# Patient Record
Sex: Female | Born: 1980 | Race: White | Hispanic: No | Marital: Married | Smoking: Never smoker
Health system: Southern US, Community
[De-identification: ages and names within clinical notes are randomized; demographics above are authoritative.]

## PROBLEM LIST (undated history)

## (undated) HISTORY — PX: TONSILLECTOMY: SUR1361

## (undated) HISTORY — PX: TUBAL LIGATION: SHX77

---

## 2006-12-29 ENCOUNTER — Emergency Department: Payer: Self-pay | Admitting: Emergency Medicine

## 2008-07-01 ENCOUNTER — Emergency Department: Payer: Self-pay | Admitting: Emergency Medicine

## 2008-10-19 ENCOUNTER — Encounter: Payer: Self-pay | Admitting: Obstetrics and Gynecology

## 2009-02-14 ENCOUNTER — Observation Stay: Payer: Self-pay | Admitting: Obstetrics and Gynecology

## 2009-02-20 ENCOUNTER — Inpatient Hospital Stay: Payer: Self-pay | Admitting: Obstetrics and Gynecology

## 2009-09-19 ENCOUNTER — Emergency Department: Payer: Self-pay | Admitting: Emergency Medicine

## 2011-01-30 ENCOUNTER — Ambulatory Visit: Payer: Self-pay

## 2013-06-22 ENCOUNTER — Emergency Department: Payer: Self-pay | Admitting: Emergency Medicine

## 2013-09-30 ENCOUNTER — Emergency Department: Payer: Self-pay | Admitting: Emergency Medicine

## 2014-03-08 ENCOUNTER — Emergency Department: Payer: Self-pay | Admitting: Internal Medicine

## 2014-03-30 ENCOUNTER — Emergency Department: Payer: Self-pay | Admitting: Emergency Medicine

## 2014-06-12 ENCOUNTER — Emergency Department: Payer: Self-pay | Admitting: Emergency Medicine

## 2014-11-30 ENCOUNTER — Emergency Department: Payer: Self-pay | Admitting: Emergency Medicine

## 2014-12-26 ENCOUNTER — Emergency Department: Payer: Self-pay | Admitting: Student

## 2015-03-31 ENCOUNTER — Encounter: Payer: Self-pay | Admitting: Emergency Medicine

## 2015-03-31 ENCOUNTER — Emergency Department
Admission: EM | Admit: 2015-03-31 | Discharge: 2015-03-31 | Disposition: A | Discharge status: Home / Self Care | Payer: Self-pay | Attending: Emergency Medicine | Admitting: Emergency Medicine

## 2015-03-31 ENCOUNTER — Emergency Department: Payer: Self-pay

## 2015-03-31 DIAGNOSIS — Y9389 Activity, other specified: Secondary | ICD-10-CM | POA: Insufficient documentation

## 2015-03-31 DIAGNOSIS — Y998 Other external cause status: Secondary | ICD-10-CM | POA: Insufficient documentation

## 2015-03-31 DIAGNOSIS — S8992XA Unspecified injury of left lower leg, initial encounter: Secondary | ICD-10-CM | POA: Insufficient documentation

## 2015-03-31 DIAGNOSIS — Y9289 Other specified places as the place of occurrence of the external cause: Secondary | ICD-10-CM | POA: Insufficient documentation

## 2015-03-31 DIAGNOSIS — S5002XA Contusion of left elbow, initial encounter: Secondary | ICD-10-CM | POA: Insufficient documentation

## 2015-03-31 DIAGNOSIS — S60419A Abrasion of unspecified finger, initial encounter: Secondary | ICD-10-CM | POA: Insufficient documentation

## 2015-03-31 DIAGNOSIS — S43402A Unspecified sprain of left shoulder joint, initial encounter: Secondary | ICD-10-CM | POA: Insufficient documentation

## 2015-03-31 DIAGNOSIS — S199XXA Unspecified injury of neck, initial encounter: Secondary | ICD-10-CM | POA: Insufficient documentation

## 2015-03-31 DIAGNOSIS — W108XXA Fall (on) (from) other stairs and steps, initial encounter: Secondary | ICD-10-CM | POA: Insufficient documentation

## 2015-03-31 DIAGNOSIS — S3992XA Unspecified injury of lower back, initial encounter: Secondary | ICD-10-CM | POA: Insufficient documentation

## 2015-03-31 DIAGNOSIS — S63502A Unspecified sprain of left wrist, initial encounter: Secondary | ICD-10-CM | POA: Insufficient documentation

## 2015-03-31 MED ORDER — HYDROCODONE-ACETAMINOPHEN 5-325 MG PO TABS
1.0000 | ORAL_TABLET | Freq: Once | ORAL | Status: AC
  Administered 2015-03-31: 1 via ORAL

## 2015-03-31 MED ORDER — IBUPROFEN 800 MG PO TABS: 800.0000 mg | ORAL_TABLET | Freq: Three times a day (TID) | ORAL | Status: DC | PRN

## 2015-03-31 MED ORDER — ONDANSETRON 8 MG PO TBDP
ORAL_TABLET | ORAL | Status: AC
  Administered 2015-03-31: 8 mg via ORAL
  Filled 2015-03-31: qty 1

## 2015-03-31 MED ORDER — HYDROCODONE-ACETAMINOPHEN 5-325 MG PO TABS: 1.0000 | ORAL_TABLET | ORAL | Status: DC | PRN

## 2015-03-31 MED ORDER — HYDROCODONE-ACETAMINOPHEN 5-325 MG PO TABS
ORAL_TABLET | ORAL | Status: AC
  Administered 2015-03-31: 1 via ORAL
  Filled 2015-03-31: qty 1

## 2015-03-31 MED ORDER — MORPHINE SULFATE 4 MG/ML IJ SOLN
INTRAMUSCULAR | Status: AC
  Administered 2015-03-31: 4 mg via INTRAMUSCULAR
  Filled 2015-03-31: qty 1

## 2015-03-31 MED ORDER — CYCLOBENZAPRINE HCL 10 MG PO TABS: 10.0000 mg | ORAL_TABLET | Freq: Three times a day (TID) | ORAL | Status: DC | PRN

## 2015-03-31 MED ORDER — ONDANSETRON 8 MG PO TBDP
8.0000 mg | ORAL_TABLET | Freq: Once | ORAL | Status: AC
  Administered 2015-03-31: 8 mg via ORAL

## 2015-03-31 MED ORDER — MORPHINE SULFATE 4 MG/ML IJ SOLN
4.0000 mg | Freq: Once | INTRAMUSCULAR | Status: AC
  Administered 2015-03-31: 4 mg via INTRAMUSCULAR

## 2015-03-31 NOTE — Discharge Instructions (Signed)
Contusion A contusion is a deep bruise. Contusions are the result of an injury that caused bleeding under the skin. The contusion may turn blue, purple, or yellow. Minor injuries will give you a painless contusion, but more severe contusions may stay painful and swollen for a few weeks.  CAUSES  A contusion is usually caused by a blow, trauma, or direct force to an area of the body. SYMPTOMS   Swelling and redness of the injured area.  Bruising of the injured area.  Tenderness and soreness of the injured area.  Pain. DIAGNOSIS  The diagnosis can be made by taking a history and physical exam. An X-ray, CT scan, or MRI may be needed to determine if there were any associated injuries, such as fractures. TREATMENT  Specific treatment will depend on what area of the body was injured. In general, the best treatment for a contusion is resting, icing, elevating, and applying cold compresses to the injured area. Over-the-counter medicines may also be recommended for pain control. Ask your caregiver what the best treatment is for your contusion. HOME CARE INSTRUCTIONS   Put ice on the injured area.  Put ice in a plastic bag.  Place a towel between your skin and the bag.  Leave the ice on for 15-20 minutes, 3-4 times a day, or as directed by your health care provider.  Only take over-the-counter or prescription medicines for pain, discomfort, or fever as directed by your caregiver. Your caregiver may recommend avoiding anti-inflammatory medicines (aspirin, ibuprofen, and naproxen) for 48 hours because these medicines may increase bruising.  Rest the injured area.  If possible, elevate the injured area to reduce swelling. SEEK IMMEDIATE MEDICAL CARE IF:   You have increased bruising or swelling.  You have pain that is getting worse.  Your swelling or pain is not relieved with medicines. MAKE SURE YOU:   Understand these instructions.  Will watch your condition.  Will get help right  away if you are not doing well or get worse. Document Released: 08/13/2005 Document Revised: 11/08/2013 Document Reviewed: 09/08/2011 [place]Exit[place] Patient Information 2015 [place]ExitCare, [place]. This information is not intended to replace advice given to you by your health care provider. Make sure you discuss any questions you have with your health care provider.  Elbow Contusion  An elbow contusion is a deep bruise of the elbow. Contusions happen when an injury causes bleeding under the skin. Signs of bruising include pain, puffiness (swelling), and discolored skin. The contusion may turn blue, purple, or yellow. HOME CARE  Put ice on the injured area.  Put ice in a plastic bag.  Place a towel between your skin and the bag.  Leave the ice on for 15-20 minutes, 03-04 times a day.  Only take medicines as told by your doctor.  Rest your elbow until the pain and puffiness are better.  Raise (elevate) your elbow to lessen puffiness.  Put on an elastic wrap as told by your doctor. You can take it off for sleeping, showers, and baths. If your fingers get cold, blue, or lose feeling (numb), take the wrap off. Put the wrap back on more loosely.  Use your elbow only as told by your doctor. If you are asked to do elbow exercises, do them as told.  Keep all doctor visits as told. GET HELP RIGHT AWAY IF:  You have more redness, puffiness, or pain in your elbow.  Your puffiness or pain is not helped by medicines.  You have puffiness of the hand  and fingers.  You are not able to move your fingers or wrist.  You start to lose feeling in your hand or fingers.  Your fingers or hand become cold or blue. MAKE SURE YOU:   Understand these instructions.  Will watch your condition.  Will get help right away if you are not doing well or get worse. Document Released: 10/23/2011 Document Revised: 05/04/2012 Document Reviewed: 10/23/2011 [facility] Patient Information 2015 [place]ExitCare, [place]LLC. This information  is not intended to replace advice given to you by your health care provider. Make sure you discuss any questions you have with your health care provider.  Joint Sprain A sprain is a tear or stretch in the ligaments that hold a joint together. Severe sprains may need as long as 3-6 weeks of immobilization and/or exercises to heal completely. Sprained joints should be rested and protected. If not, they can become unstable and prone to re-injury. Proper treatment can reduce your pain, shorten the period of disability, and reduce the risk of repeated injuries. TREATMENT   Rest and elevate the injured joint to reduce pain and swelling.  Apply ice packs to the injury for 20-30 minutes every 2-3 hours for the next 2-3 days.  Keep the injury wrapped in a compression bandage or splint as long as the joint is painful or as instructed by your caregiver.  Do not use the injured joint until it is completely healed to prevent re-injury and chronic instability. Follow the instructions of your caregiver.  Long-term sprain management may require exercises and/or treatment by a physical therapist. Taping or special braces may help stabilize the joint until it is completely better. SEEK MEDICAL CARE IF:   You develop increased pain or swelling of the joint.  You develop increasing redness and warmth of the joint.  You develop a fever.  It becomes stiff.  Your hand or foot gets cold or numb. Document Released: 12/11/2004 Document Revised: 01/26/2012 Document Reviewed: 11/20/2008 [facility]ExitCare Patient Information 2015 [place]ExitCare, MarylandLLC. This information is not intended to replace advice given to you by your health care provider. Make sure you discuss any questions you have with your health care provider.   Take pain medicine as directed and follow up with the Orthopedist if not improving.   Return to ER for any worsening symptoms.

## 2015-03-31 NOTE — ED Provider Notes (Signed)
[facility] Provider Note ____________________________________________  Time seen: ----------------------------------------- 7:17 PM on 03/31/2015 -----------------------------------------    I have reviewed the triage vital signs and the nursing notes.   HISTORY  Chief Complaint Fall    HPI Cynthia Norman is a 34 y.o. female who fell down 3 steps injuring her left arm and left leg.  She c/o pain from her neck down to her left hand and is unwilling to move it at all.  she has some pain to her buttock but walking without difficulty.   No prior hx of injury to her neck, arm, leg.   History reviewed. No pertinent past medical history.  There are no active problems to display for this patient.   Past Surgical History  Procedure Laterality Date  . Tonsillectomy    . Cesarean section      Current Outpatient Rx  Name  Route  Sig  Dispense  Refill  . cyclobenzaprine (FLEXERIL) 10 MG tablet   Oral   Take 1 tablet (10 mg total) by mouth 3 (three) times daily as needed for muscle spasms.   30 tablet   0   . HYDROcodone-acetaminophen (NORCO) 5-325 MG per tablet   Oral   Take 1 tablet by mouth every 4 (four) hours as needed for moderate pain.   12 tablet   0   . ibuprofen (ADVIL,MOTRIN) 800 MG tablet   Oral   Take 1 tablet (800 mg total) by mouth every 8 (eight) hours as needed.   15 tablet   0     Allergies Levaquin  History reviewed. No pertinent family history.  Social History History  Substance Use Topics  . Smoking status: Never Smoker   . Smokeless tobacco: Never Used  . Alcohol Use: No    Review of Systems  Constitutional: Negative for fever. Eyes: Negative for visual changes. ENT: Negative for sore throat. Cardiovascular: Negative for chest pain. Respiratory: Negative for shortness of breath. Gastrointestinal: Negative for abdominal pain, vomiting and diarrhea. Genitourinary: Negative for  dysuria. Musculoskeletal: Negative for back pain. Skin: Negative for rash. Neurological: Negative for headaches, focal weakness or numbness.   10-point ROS otherwise negative.  ____________________________________________   PHYSICAL EXAM:  VITAL SIGNS: ED Triage Vitals  Enc Vitals Group     BP 03/31/15 1837 130/81 mmHg     Pulse Rate 03/31/15 1837 83     Resp 03/31/15 1837 20     Temp 03/31/15 1837 98 F (36.7 C)     Temp Source 03/31/15 1837 Oral     SpO2 03/31/15 1837 100 %     Weight 03/31/15 1837 170 lb (77.111 kg)     Height 03/31/15 1837 5\' 5"  (1.651 m)     Head Cir --      Peak Flow --      Pain Score 03/31/15 1837 9     Pain Loc --      Pain Edu? --      Excl. in GC? --     Constitutional: Alert and oriented. Well appearing and in no distress. Eyes: Conjunctivae are normal. PERRL. Normal extraocular movements. ENT   Head: Normocephalic and atraumatic.   Nose: No congestion/rhinnorhea.   Mouth/Throat: Mucous membranes are moist.   Neck: no cervical tenderness. nml rom. Mild left paracervical tenderness. Hematological/Lymphatic/Immunilogical: No cervical lymphadenopathy. Cardiovascular: Normal rate, regular rhythm. Normal and symmetric distal pulses are present in all extremities. No murmurs, rubs, or gallops. Respiratory: Normal respiratory effort without tachypnea  nor retractions. Breath sounds are clear and equal bilaterally. No wheezes/rales/rhonchi. Musculoskeletal: Nontender with normal range of motion in all extremities. No joint effusions.  No lower extremity tenderness nor edema. Neurologic:  Normal speech and language. No gross focal neurologic deficits are appreciated. Speech is normal. Skin:  Skin is warm, dry and intact. No rash noted. Psychiatric: Mood and affect are normal. Speech and behavior are normal. Patient exhibits appropriate insight and judgment.  ____________________________________________    LABS (pertinent  positives/negatives)    ____________________________________________   EKG    ____________________________________________    RADIOLOGY  Neg shoulder, elbow and wrist films.  ____________________________________________   PROCEDURES  Procedure(s) performed: None  Critical Care performed: No  ____________________________________________   INITIAL IMPRESSION / ASSESSMENT AND PLAN / ED COURSE  Shoulder sprain, elbow contusion, wrist sprain.  Wear sling for support.  Ibuprofen, norco and flexeril for symptomatic relief.  Follow up with orthopedist if not improving.  Pertinent labs & imaging results that were available during my care of the patient were reviewed by me and considered in my medical decision making (see chart for details).  ____________________________________________   FINAL CLINICAL IMPRESSION(S) / ED DIAGNOSES  Final diagnoses:  Shoulder sprain, left, initial encounter  Elbow contusion, left, initial encounter  Wrist sprain, left, initial encounter      Ignacia BayleyRobert Hallel Denherder, PA-C 03/31/15 2108

## 2015-03-31 NOTE — ED Notes (Signed)
Pt c/o left arm, shoulder, and leg pain following a fall down three steps at home half an hour ago.  She states that she slipped on the wet stairs and went down, landing on her rear end and then onto her left side.  She has minor abrasions to the fingers on her left hand and swelling at the elbow joint. She states that her pain is from her shoulder down to her fingertips and is rated as 9/10.  She has not taken any medication for her pain.

## 2015-04-22 IMAGING — CR RIGHT HAND - COMPLETE 3+ VIEW
1 series · 3 of 3 positions shown · non-contrast
Comparison: None

CLINICAL DATA: RIGHT wrist and hand pain, fell today

EXAM:
RIGHT HAND - COMPLETE 3+ VIEW

[Series 1: pa · 0.17mm/px · 3 of 3 slices shown]
[im 1/3]
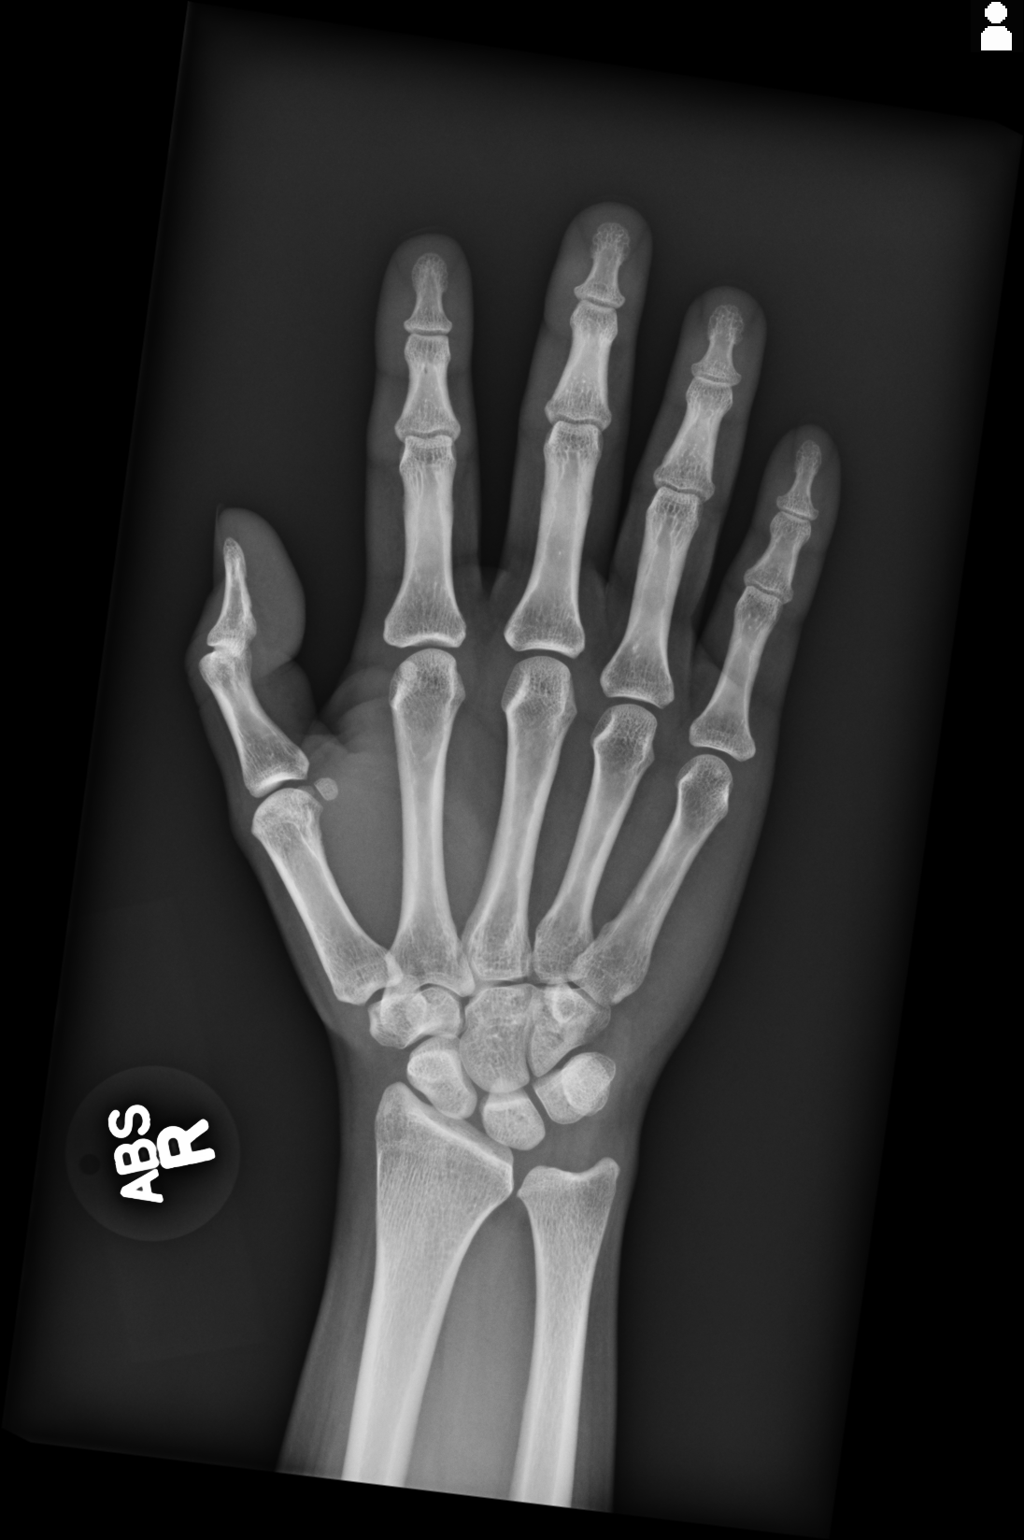
[im 2/3]
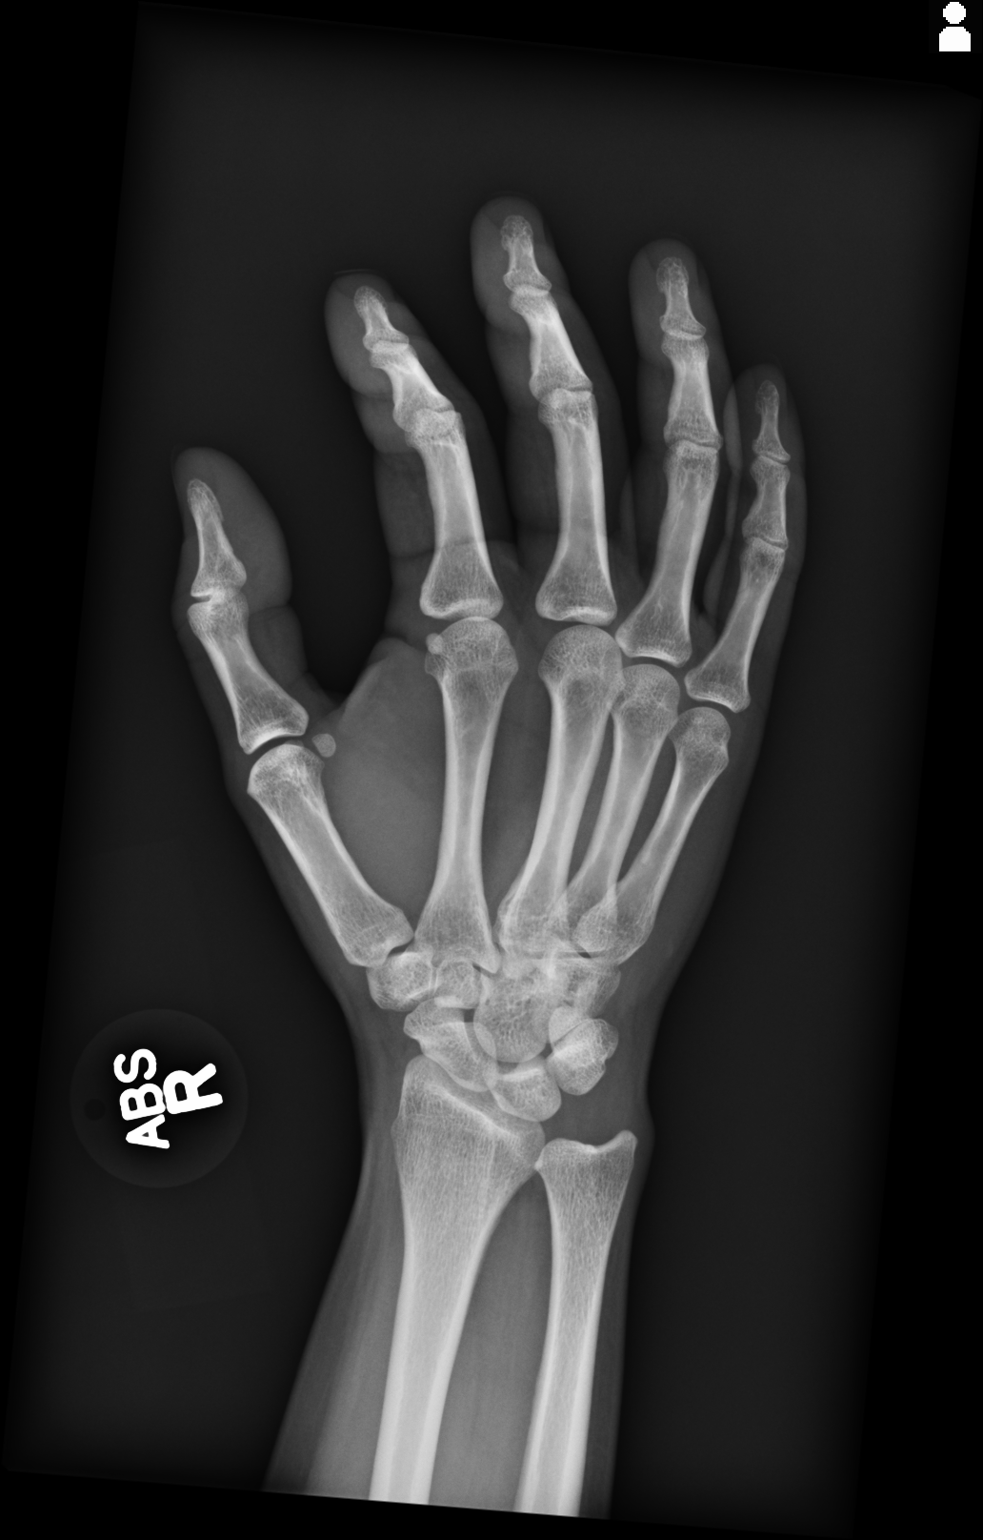
[im 3/3]
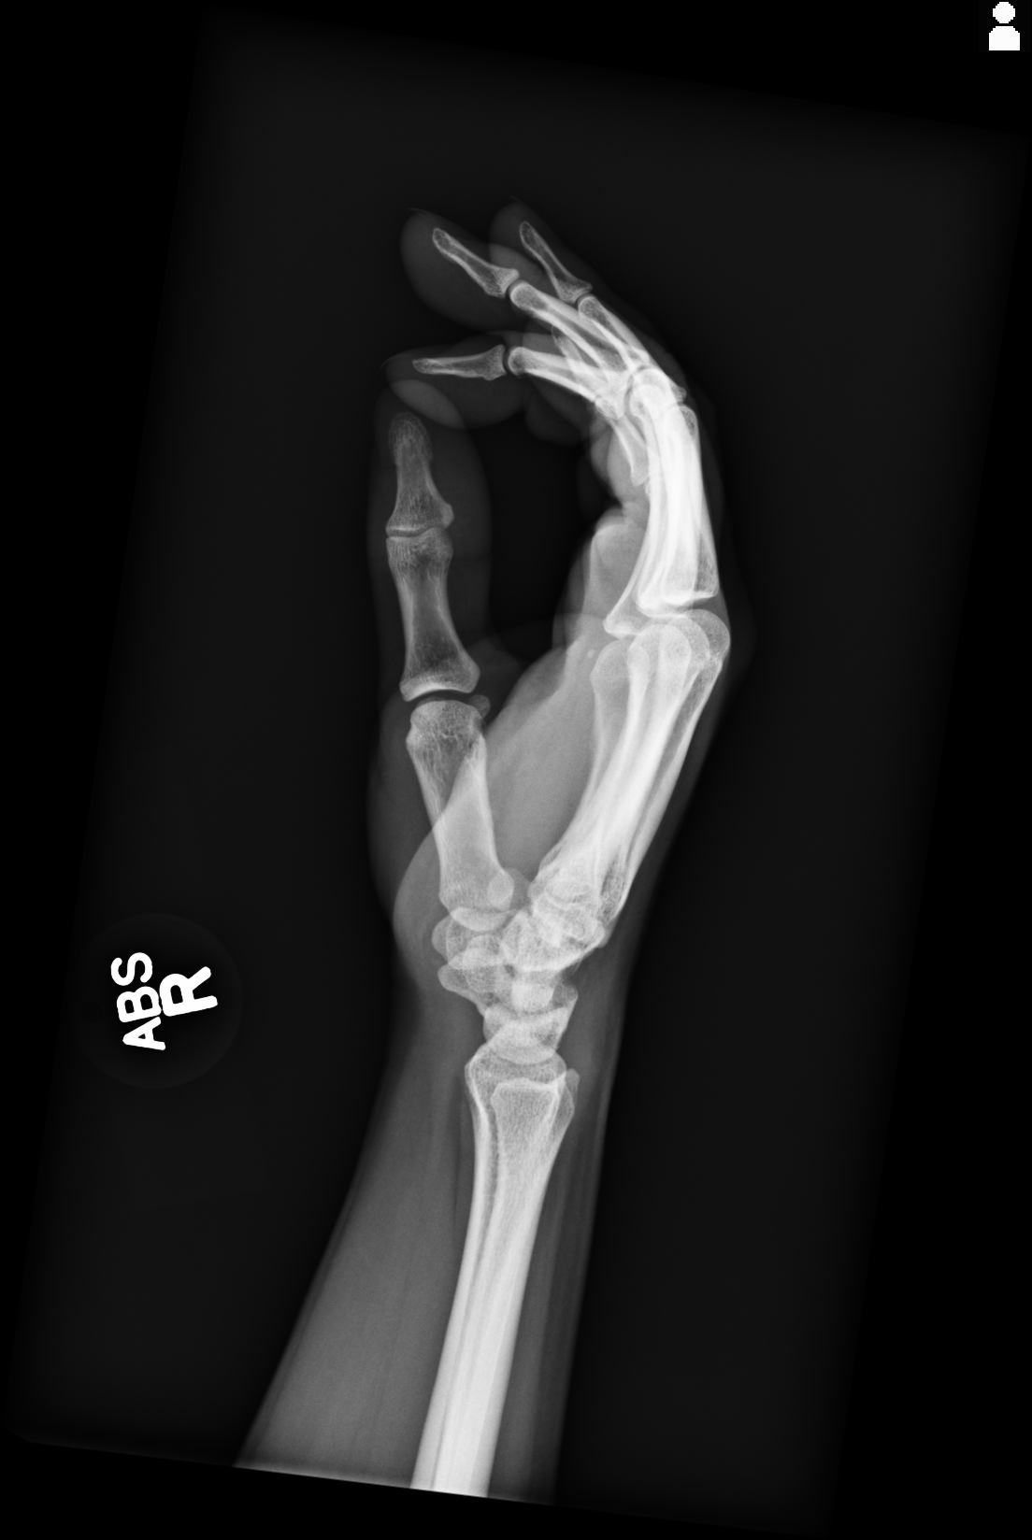

[3 of 3 positions shown; findings below may reference images not displayed]

FINDINGS: Osseous mineralization normal.

Fingers superimposed on lateral view limiting assessment.

Joint spaces preserved.

No acute fracture, dislocation or bone destruction.

Soft tissues unremarkable.
IMPRESSION: No acute osseous abnormalities.

## 2015-05-30 ENCOUNTER — Encounter: Payer: Self-pay | Admitting: Emergency Medicine

## 2015-05-30 ENCOUNTER — Emergency Department: Payer: Self-pay

## 2015-05-30 ENCOUNTER — Emergency Department
Admission: EM | Admit: 2015-05-30 | Discharge: 2015-05-30 | Disposition: A | Discharge status: Home / Self Care | Payer: Self-pay | Attending: Emergency Medicine | Admitting: Emergency Medicine

## 2015-05-30 DIAGNOSIS — Z3202 Encounter for pregnancy test, result negative: Secondary | ICD-10-CM | POA: Insufficient documentation

## 2015-05-30 DIAGNOSIS — R51 Headache: Secondary | ICD-10-CM | POA: Insufficient documentation

## 2015-05-30 DIAGNOSIS — R42 Dizziness and giddiness: Secondary | ICD-10-CM | POA: Insufficient documentation

## 2015-05-30 DIAGNOSIS — K219 Gastro-esophageal reflux disease without esophagitis: Secondary | ICD-10-CM | POA: Insufficient documentation

## 2015-05-30 DIAGNOSIS — R109 Unspecified abdominal pain: Secondary | ICD-10-CM

## 2015-05-30 LAB — URINALYSIS COMPLETE WITH MICROSCOPIC (ARMC ONLY)
BILIRUBIN URINE: NEGATIVE
Glucose, UA: NEGATIVE mg/dL
Hgb urine dipstick: NEGATIVE
Ketones, ur: NEGATIVE mg/dL
Nitrite: NEGATIVE
PROTEIN: NEGATIVE mg/dL
Specific Gravity, Urine: 1.019 (ref 1.005–1.030)
pH: 6 (ref 5.0–8.0)

## 2015-05-30 LAB — COMPREHENSIVE METABOLIC PANEL
ALBUMIN: 4.4 g/dL (ref 3.5–5.0)
ALT: 11 U/L — AB (ref 14–54)
ANION GAP: 5 (ref 5–15)
AST: 15 U/L (ref 15–41)
Alkaline Phosphatase: 56 U/L (ref 38–126)
BILIRUBIN TOTAL: 0.5 mg/dL (ref 0.3–1.2)
BUN: 10 mg/dL (ref 6–20)
CO2: 26 mmol/L (ref 22–32)
Calcium: 9.3 mg/dL (ref 8.9–10.3)
Chloride: 106 mmol/L (ref 101–111)
Creatinine, Ser: 0.87 mg/dL (ref 0.44–1.00)
GFR calc Af Amer: >60 mL/min (ref >60)
GFR calc non Af Amer: >60 mL/min (ref >60)
GLUCOSE: 104 mg/dL — AB (ref 65–99)
Potassium: 3.9 mmol/L (ref 3.5–5.1)
Sodium: 137 mmol/L (ref 135–145)
Total Protein: 8.2 g/dL — ABNORMAL HIGH (ref 6.5–8.1)

## 2015-05-30 LAB — CBC
HEMATOCRIT: 40.2 % (ref 35.0–47.0)
Hemoglobin: 13.3 g/dL (ref 12.0–16.0)
MCH: 29.3 pg (ref 26.0–34.0)
MCHC: 33.2 g/dL (ref 32.0–36.0)
MCV: 88.3 fL (ref 80.0–100.0)
Platelets: 293 10*3/uL (ref 150–440)
RBC: 4.55 MIL/uL (ref 3.80–5.20)
RDW: 13.9 % (ref 11.5–14.5)
WBC: 8.7 10*3/uL (ref 3.6–11.0)

## 2015-05-30 LAB — PREGNANCY, URINE: Preg Test, Ur: NEGATIVE

## 2015-05-30 LAB — LIPASE, BLOOD: Lipase: 46 U/L (ref 22–51)

## 2015-05-30 MED ORDER — RANITIDINE HCL 150 MG PO TABS: 150.0000 mg | ORAL_TABLET | Freq: Every day | ORAL | Status: DC

## 2015-05-30 MED ORDER — SUCRALFATE 1 G PO TABS: 1.0000 g | ORAL_TABLET | Freq: Four times a day (QID) | ORAL | Status: DC

## 2015-05-30 NOTE — ED Provider Notes (Signed)
[facility] Emergency Department Provider Note     Time seen: ----------------------------------------- 8:46 PM on 05/30/2015 -----------------------------------------    I have reviewed the triage vital signs and the nursing notes.   HISTORY  Chief Complaint Abdominal Pain    HPI Cynthia Norman is a 34 y.o. female who presents ER for upper abdominal pain and lower back pain for last 3 weeks. Patient reports pain radiating through to her back. Patient reports it as the type of pain that is like pregnancy contractions but not quite as strong. She does report persistent nausea headache and dizziness that preceded the pain. Does report history a tubal ligation.   History reviewed. No pertinent past medical history.  There are no active problems to display for this patient.   Past Surgical History  Procedure Laterality Date  . Tonsillectomy    . Cesarean section    . Tubal ligation      Allergies Levaquin  Social History History  Substance Use Topics  . Smoking status: Never Smoker   . Smokeless tobacco: Never Used  . Alcohol Use: No    Review of Systems Constitutional: Negative for fever. Eyes: Negative for visual changes. ENT: Negative for sore throat. Cardiovascular: Negative for chest pain. Respiratory: Negative for shortness of breath. Gastrointestinal: Positive for abdominal pain and nausea Genitourinary: Negative for dysuria. Musculoskeletal: Negative for back pain. Skin: Negative for rash. Neurological: Positive for headache and dizziness  10-point ROS otherwise negative.  ____________________________________________   PHYSICAL EXAM:  VITAL SIGNS: ED Triage Vitals  Enc Vitals Group     BP 05/30/15 1730 121/76 mmHg     Pulse Rate 05/30/15 1730 80     Resp --      Temp 05/30/15 1730 98.4 F (36.9 C)     Temp Source 05/30/15 1730 Oral     SpO2 05/30/15 1730 100 %     Weight 05/30/15 1730 178 lb (80.74 kg)   Height 05/30/15 1730  (1.651 m)     Head Cir --      Peak Flow --      Pain Score 05/30/15 1741 2     Pain Loc --      Pain Edu? --      Excl. in GC? --     Constitutional: Alert and oriented. Well appearing and in no distress. Eyes: Conjunctivae are normal. PERRL. Normal extraocular movements. ENT   Head: Normocephalic and atraumatic.   Nose: No congestion/rhinnorhea.   Mouth/Throat: Mucous membranes are moist.   Neck: No stridor. Hematological/Lymphatic/Immunilogical: No cervical lymphadenopathy. Cardiovascular: Normal rate, regular rhythm. Normal and symmetric distal pulses are present in all extremities. No murmurs, rubs, or gallops. Respiratory: Normal respiratory effort without tachypnea nor retractions. Breath sounds are clear and equal bilaterally. No wheezes/rales/rhonchi. Gastrointestinal: Soft and nontender. No distention. No abdominal bruits. There is no CVA tenderness. Musculoskeletal: Nontender with normal range of motion in all extremities. No joint effusions.  No lower extremity tenderness nor edema. Neurologic:  Normal speech and language. No gross focal neurologic deficits are appreciated. Speech is normal. No gait instability. Skin:  Skin is warm, dry and intact. No rash noted. Psychiatric: Mood and affect are normal. Speech and behavior are normal. Patient exhibits appropriate insight and judgment. ____________________________________________  ED COURSE:  Pertinent labs & imaging results that were available during my care of the patient were reviewed by me and considered in my medical decision making (see chart for details). Patient will need abdominal labs and imaging.  Pain seems to be located in the right upper quadrant ____________________________________________    LABS (pertinent positives/negatives)  Labs Reviewed  COMPREHENSIVE METABOLIC PANEL - Abnormal; Notable for the following:    Glucose, Bld 104 (*)    Total Protein 8.2 (*)     ALT 11 (*)    All other components within normal limits  URINALYSIS COMPLETEWITH MICROSCOPIC ([facility] ONLY) - Abnormal; Notable for the following:    Color, Urine YELLOW (*)    APPearance CLEAR (*)    Leukocytes, UA 2+ (*)    Bacteria, UA RARE (*)    Squamous Epithelial / LPF 0-5 (*)    All other components within normal limits  LIPASE, BLOOD  CBC  PREGNANCY, URINE    RADIOLOGY  Abdominal ultrasound IMPRESSION: Normal right upper quadrant ultrasound.  ____________________________________________  FINAL ASSESSMENT AND PLAN  Abdominal pain  Plan: Patient with labs and imaging as dictated above. Patient be discharged on antacids and referral for follow-up with her primary care doctor. This is likely either GERD or ulcer related.   Emily FilbertWilliams, Jonathan E, MD   Emily FilbertJonathan E Williams, MD 05/30/15 2229

## 2015-05-30 NOTE — ED Notes (Signed)
Pt reports upper abdominal pain and lower back pain x3 weeks; pt reports pain radiated thru back; feels like contractions but not as strong. Pt reports nausea, dizziness and headache. Pt reports negative pregnancy test, reports tubal ligation.

## 2015-05-30 NOTE — Discharge Instructions (Signed)
Abdominal Pain °Many things can cause abdominal pain. Usually, abdominal pain is not caused by a disease and will improve without treatment. It can often be observed and treated at home. Your health care provider will do a physical exam and possibly order blood tests and X-rays to help determine the seriousness of your pain. However, in many cases, more time must pass before a clear cause of the pain can be found. Before that point, your health care provider may not know if you need more testing or further treatment. °HOME CARE INSTRUCTIONS  °Monitor your abdominal pain for any changes. The following actions may help to alleviate any discomfort you are experiencing: °· Only take over-the-counter or prescription medicines as directed by your health care provider. °· Do not take laxatives unless directed to do so by your health care provider. °· Try a clear liquid diet (broth, tea, or water) as directed by your health care provider. Slowly move to a bland diet as tolerated. °SEEK MEDICAL CARE IF: °· You have unexplained abdominal pain. °· You have abdominal pain associated with nausea or diarrhea. °· You have pain when you urinate or have a bowel movement. °· You experience abdominal pain that wakes you in the night. °· You have abdominal pain that is worsened or improved by eating food. °· You have abdominal pain that is worsened with eating fatty foods. °· You have a fever. °SEEK IMMEDIATE MEDICAL CARE IF:  °· Your pain does not go away within 2 hours. °· You keep throwing up (vomiting). °· Your pain is felt only in portions of the abdomen, such as the right side or the left lower portion of the abdomen. °· You pass bloody or black tarry stools. °MAKE SURE YOU: °· Understand these instructions.   °· Will watch your condition.   °· Will get help right away if you are not doing well or get worse.   °Document Released: 08/13/2005 Document Revised: 11/08/2013 Document Reviewed: 07/13/2013 °ExitCare® Patient Information  ©2015 Exit[employer], LLC. This information is not intended to replace advice given to you by your health care provider. Make sure you discuss any questions you have with your health care provider. ° °Gastroesophageal Reflux Disease, Adult °Gastroesophageal reflux disease (GERD) happens when acid from your stomach flows up into the esophagus. When acid comes in contact with the esophagus, the acid causes soreness (inflammation) in the esophagus. Over time, GERD may create small holes (ulcers) in the lining of the esophagus. °CAUSES  °· Increased body weight. This puts pressure on the stomach, making acid rise from the stomach into the esophagus. °· Smoking. This increases acid production in the stomach. °· Drinking alcohol. This causes decreased pressure in the lower esophageal sphincter (valve or ring of muscle between the esophagus and stomach), allowing acid from the stomach into the esophagus. °· Late evening meals and a full stomach. This increases pressure and acid production in the stomach. °· A malformed lower esophageal sphincter. °Sometimes, no cause is found. °SYMPTOMS  °· Burning pain in the lower part of the mid-chest behind the breastbone and in the mid-stomach area. This may occur twice a week or more often. °· Trouble swallowing. °· Sore throat. °· Dry cough. °· Asthma-like symptoms including chest tightness, shortness of breath, or wheezing. °DIAGNOSIS  °Your caregiver may be able to diagnose GERD based on your symptoms. In some cases, X-rays and other tests may be done to check for complications or to check the condition of your stomach and esophagus. °TREATMENT  °Your caregiver may   recommend over-the-counter or prescription medicines to help decrease acid production. Ask your caregiver before starting or adding any new medicines.  °HOME CARE INSTRUCTIONS  °· Change the factors that you can control. Ask your caregiver for guidance concerning weight loss, quitting smoking, and alcohol  consumption. °· Avoid foods and drinks that make your symptoms worse, such as: °¨ Caffeine or alcoholic drinks. °¨ Chocolate. °¨ Peppermint or mint flavorings. °¨ Garlic and onions. °¨ Spicy foods. °¨ Citrus fruits, such as oranges, lemons, or limes. °¨ Tomato-based foods such as sauce, chili, salsa, and pizza. °¨ Fried and fatty foods. °· Avoid lying down for the 3 hours prior to your bedtime or prior to taking a nap. °· Eat small, frequent meals instead of large meals. °· Wear loose-fitting clothing. Do not wear anything tight around your waist that causes pressure on your stomach. °· Raise the head of your bed 6 to 8 inches with wood blocks to help you sleep. Extra pillows will not help. °· Only take over-the-counter or prescription medicines for pain, discomfort, or fever as directed by your caregiver. °· Do not take aspirin, ibuprofen, or other nonsteroidal anti-inflammatory drugs (NSAIDs). °SEEK IMMEDIATE MEDICAL CARE IF:  °· You have pain in your arms, neck, jaw, teeth, or back. °· Your pain increases or changes in intensity or duration. °· You develop nausea, vomiting, or sweating (diaphoresis). °· You develop shortness of breath, or you faint. °· Your vomit is green, yellow, black, or looks like coffee grounds or blood. °· Your stool is red, bloody, or black. °These symptoms could be signs of other problems, such as heart disease, gastric bleeding, or esophageal bleeding. °MAKE SURE YOU:  °· Understand these instructions. °· Will watch your condition. °· Will get help right away if you are not doing well or get worse. °Document Released: 08/13/2005 Document Revised: 01/26/2012 Document Reviewed: 05/23/2011 °ExitCare® Patient Information ©2015 [employer], LLC. This information is not intended to replace advice given to you by your health care provider. Make sure you discuss any questions you have with your health care provider. ° °

## 2015-07-28 ENCOUNTER — Emergency Department
Admission: EM | Admit: 2015-07-28 | Discharge: 2015-07-28 | Disposition: A | Discharge status: Home / Self Care | Payer: Self-pay | Attending: Emergency Medicine | Admitting: Emergency Medicine

## 2015-07-28 ENCOUNTER — Encounter: Payer: Self-pay | Admitting: Emergency Medicine

## 2015-07-28 DIAGNOSIS — Z3202 Encounter for pregnancy test, result negative: Secondary | ICD-10-CM | POA: Insufficient documentation

## 2015-07-28 DIAGNOSIS — Z79899 Other long term (current) drug therapy: Secondary | ICD-10-CM | POA: Insufficient documentation

## 2015-07-28 DIAGNOSIS — J029 Acute pharyngitis, unspecified: Secondary | ICD-10-CM | POA: Insufficient documentation

## 2015-07-28 DIAGNOSIS — J040 Acute laryngitis: Secondary | ICD-10-CM | POA: Insufficient documentation

## 2015-07-28 LAB — POCT RAPID STREP A: Streptococcus, Group A Screen (Direct): NEGATIVE

## 2015-07-28 MED ORDER — MAGIC MOUTHWASH W/LIDOCAINE: 5.0000 mL | Freq: Four times a day (QID) | ORAL | Status: DC

## 2015-07-28 MED ORDER — METHYLPREDNISOLONE 4 MG PO TBPK: ORAL_TABLET | ORAL | Status: DC

## 2015-07-28 NOTE — ED Notes (Addendum)
Pt reports sore throat that started last night. Unknown fever. Pt reports "feeling warm".  Denies any other complaints

## 2015-07-28 NOTE — ED Provider Notes (Signed)
[facility] Emergency Department Provider Note  ____________________________________________  Time seen: Approximately 5:28 PM  I have reviewed the triage vital signs and the nursing notes.   HISTORY  Chief Complaint Sore Throat    HPI Cynthia Norman is a 34 y.o. female patient complaining of sore throat that started last night. Patient states feels warm but has not taken her temperature. Patient complains of loss of voice volume. Patient states she has pain with swallowing. Patient denies any URI signs symptoms. No palliative measures taken for this complaint.  History reviewed. No pertinent past medical history.  There are no active problems to display for this patient.   Past Surgical History  Procedure Laterality Date  . Tonsillectomy    . Cesarean section    . Tubal ligation      Current Outpatient Rx  Name  Route  Sig  Dispense  Refill  . cyclobenzaprine (FLEXERIL) 10 MG tablet   Oral   Take 1 tablet (10 mg total) by mouth 3 (three) times daily as needed for muscle spasms.   30 tablet   0   . HYDROcodone-acetaminophen (NORCO) 5-325 MG per tablet   Oral   Take 1 tablet by mouth every 4 (four) hours as needed for moderate pain.   12 tablet   0   . ibuprofen (ADVIL,MOTRIN) 800 MG tablet   Oral   Take 1 tablet (800 mg total) by mouth every 8 (eight) hours as needed.   15 tablet   0   . ranitidine (ZANTAC) 150 MG tablet   Oral   Take 1 tablet (150 mg total) by mouth at bedtime.   30 tablet   1   . sucralfate (CARAFATE) 1 G tablet   Oral   Take 1 tablet (1 g total) by mouth 4 (four) times daily.   120 tablet   1     Allergies Levaquin  No family history on file.  Social History Social History  Substance Use Topics  . Smoking status: Never Smoker   . Smokeless tobacco: Never Used  . Alcohol Use: No    Review of Systems Constitutional: No fever/chills Eyes: No visual changes. ENT: Sore throat Cardiovascular: Denies  chest pain. Respiratory: Denies shortness of breath. Gastrointestinal: No abdominal pain.  No nausea, no vomiting.  No diarrhea.  No constipation. Genitourinary: Negative for dysuria. Musculoskeletal: Negative for back pain. Skin: Negative for rash. Neurological: Negative for headaches, focal weakness or numbness. 10-point ROS otherwise negative.  ____________________________________________   PHYSICAL EXAM:  VITAL SIGNS: ED Triage Vitals  Enc Vitals Group     BP 07/28/15 1627 125/84 mmHg     Pulse Rate 07/28/15 1627 99     Resp 07/28/15 1627 18     Temp 07/28/15 1627 99 F (37.2 C)     Temp Source 07/28/15 1627 Oral     SpO2 07/28/15 1627 99 %     Weight 07/28/15 1627 175 lb (79.379 kg)     Height 07/28/15 1627  (1.651 m)     Head Cir --      Peak Flow --      Pain Score 07/28/15 1628 9     Pain Loc --      Pain Edu? --      Excl. in GC? --     Constitutional: Alert and oriented. Well appearing and in no acute distress. Eyes: Conjunctivae are normal. PERRL. EOMI. Head: Atraumatic. Nose: No congestion/rhinnorhea. Mouth/Throat: Mucous membranes are moist.  Oropharynx non-erythematous. Neck: No stridor.   Hematological/Lymphatic/Immunilogical: No cervical lymphadenopathy. Cardiovascular: Normal rate, regular rhythm. Grossly normal heart sounds.  Good peripheral circulation. Respiratory: Normal respiratory effort.  No retractions. Lungs CTAB. Gastrointestinal: Soft and nontender. No distention. No abdominal bruits. No CVA tenderness. Musculoskeletal: No lower extremity tenderness nor edema.  No joint effusions. Neurologic:  Normal speech and language. No gross focal neurologic deficits are appreciated. No gait instability. Skin:  Skin is warm, dry and intact. No rash noted. Psychiatric: Mood and affect are normal. Speech and behavior are normal.  ____________________________________________   LABS (all labs ordered are listed, but only abnormal results are  displayed)  Labs Reviewed  POCT RAPID STREP A   ____________________________________________  EKG   ____________________________________________  RADIOLOGY   ____________________________________________   PROCEDURES  Procedure(s) performed: None  Critical Care performed: No  ____________________________________________   INITIAL IMPRESSION / ASSESSMENT AND PLAN / ED COURSE  Pertinent labs & imaging results that were available during my care of the patient were reviewed by me and considered in my medical decision making (see chart for details).  Viral pharyngitis. Advised patient to culture is pending. Discussed supportive care for sore throat and the need for force rest secondary to laryngitis. Patient given a prescription for Magic mouthwash and prednisone. Patient given a work note for today. ____________________________________________   FINAL CLINICAL IMPRESSION(S) / ED DIAGNOSES  Final diagnoses:  Pharyngitis  Laryngitis      Joni Reining, PA-C 07/28/15 1745  Joni Reining, PA-C 07/28/15 1748  Myrna Blazer, MD 07/29/15 0002

## 2015-12-12 ENCOUNTER — Other Ambulatory Visit: Payer: Self-pay

## 2015-12-12 ENCOUNTER — Encounter: Payer: Self-pay | Admitting: Emergency Medicine

## 2015-12-12 ENCOUNTER — Emergency Department: Payer: Self-pay

## 2015-12-12 ENCOUNTER — Emergency Department
Admission: EM | Admit: 2015-12-12 | Discharge: 2015-12-12 | Disposition: A | Discharge status: Home / Self Care | Payer: Self-pay | Attending: Emergency Medicine | Admitting: Emergency Medicine

## 2015-12-12 DIAGNOSIS — S93402A Sprain of unspecified ligament of left ankle, initial encounter: Secondary | ICD-10-CM

## 2015-12-12 DIAGNOSIS — S99911A Unspecified injury of right ankle, initial encounter: Secondary | ICD-10-CM | POA: Insufficient documentation

## 2015-12-12 DIAGNOSIS — Y998 Other external cause status: Secondary | ICD-10-CM | POA: Insufficient documentation

## 2015-12-12 DIAGNOSIS — X501XXA Overexertion from prolonged static or awkward postures, initial encounter: Secondary | ICD-10-CM | POA: Insufficient documentation

## 2015-12-12 DIAGNOSIS — Y9289 Other specified places as the place of occurrence of the external cause: Secondary | ICD-10-CM | POA: Insufficient documentation

## 2015-12-12 DIAGNOSIS — Y9389 Activity, other specified: Secondary | ICD-10-CM | POA: Insufficient documentation

## 2015-12-12 MED ORDER — IBUPROFEN 800 MG PO TABS: 800.0000 mg | ORAL_TABLET | Freq: Three times a day (TID) | ORAL | Status: DC | PRN

## 2015-12-12 MED ORDER — HYDROCODONE-ACETAMINOPHEN 5-325 MG PO TABS: 1.0000 | ORAL_TABLET | ORAL | Status: AC | PRN

## 2015-12-12 NOTE — Discharge Instructions (Signed)
Ankle Sprain °An ankle sprain is an injury to the strong, fibrous tissues (ligaments) that hold the bones of your ankle joint together.  °CAUSES °An ankle sprain is usually caused by a fall or by twisting your ankle. Ankle sprains most commonly occur when you step on the outer edge of your foot, and your ankle turns inward. People who participate in sports are more prone to these types of injuries.  °SYMPTOMS  °· Pain in your ankle. The pain may be present at rest or only when you are trying to stand or walk. °· Swelling. °· Bruising. Bruising may develop immediately or within 1 to 2 days after your injury. °· Difficulty standing or walking, particularly when turning corners or changing directions. °DIAGNOSIS  °Your caregiver will ask you details about your injury and perform a physical exam of your ankle to determine if you have an ankle sprain. During the physical exam, your caregiver will press on and apply pressure to specific areas of your foot and ankle. Your caregiver will try to move your ankle in certain ways. An X-ray exam may be done to be sure a bone was not broken or a ligament did not separate from one of the bones in your ankle (avulsion fracture).  °TREATMENT  °Certain types of braces can help stabilize your ankle. Your caregiver can make a recommendation for this. Your caregiver may recommend the use of medicine for pain. If your sprain is severe, your caregiver may refer you to a surgeon who helps to restore function to parts of your skeletal system (orthopedist) or a physical therapist. °HOME CARE INSTRUCTIONS  °· Apply ice to your injury for 1-2 days or as directed by your caregiver. Applying ice helps to reduce inflammation and pain. °· Put ice in a plastic bag. °· Place a towel between your skin and the bag. °· Leave the ice on for 15-20 minutes at a time, every 2 hours while you are awake. °· Only take over-the-counter or prescription medicines for pain, discomfort, or fever as directed by  your caregiver. °· Elevate your injured ankle above the level of your heart as much as possible for 2-3 days. °· If your caregiver recommends crutches, use them as instructed. Gradually put weight on the affected ankle. Continue to use crutches or a cane until you can walk without feeling pain in your ankle. °· If you have a plaster splint, wear the splint as directed by your caregiver. Do not rest it on anything harder than a pillow for the first 24 hours. Do not put weight on it. Do not get it wet. You may take it off to take a shower or bath. °· You may have been given an elastic bandage to wear around your ankle to provide support. If the elastic bandage is too tight (you have numbness or tingling in your foot or your foot becomes cold and blue), adjust the bandage to make it comfortable. °· If you have an air splint, you may blow more air into it or let air out to make it more comfortable. You may take your splint off at night and before taking a shower or bath. Wiggle your toes in the splint several times per day to decrease swelling. °SEEK MEDICAL CARE IF:  °· You have rapidly increasing bruising or swelling. °· Your toes feel extremely cold or you lose feeling in your foot. °· Your pain is not relieved with medicine. °SEEK IMMEDIATE MEDICAL CARE IF: °· Your toes are numb or blue. °·   You have severe pain that is increasing. °MAKE SURE YOU:  °· Understand these instructions. °· Will watch your condition. °· Will get help right away if you are not doing well or get worse. °  °This information is not intended to replace advice given to you by your health care provider. Make sure you discuss any questions you have with your health care provider. °  °Document Released: 11/03/2005 Document Revised: 11/24/2014 Document Reviewed: 11/15/2011 °Elsevier Interactive Patient Education ©2016 [employer] ° °Cryotherapy °Cryotherapy means treatment with cold. Ice or gel packs can be used to reduce both pain and swelling.  Ice is the most helpful within the first 24 to 48 hours after an injury or flare-up from overusing a muscle or joint. Sprains, strains, spasms, burning pain, shooting pain, and aches can all be eased with ice. Ice can also be used when recovering from surgery. Ice is effective, has very few side effects, and is safe for most people to use. °PRECAUTIONS  °Ice is not a safe treatment option for people with: °· Raynaud phenomenon. This is a condition affecting small blood vessels in the extremities. Exposure to cold may cause your problems to return. °· Cold hypersensitivity. There are many forms of cold hypersensitivity, including: °¨ Cold urticaria. Red, itchy hives appear on the skin when the tissues begin to warm after being iced. °¨ Cold erythema. This is a red, itchy rash caused by exposure to cold. °¨ Cold hemoglobinuria. Red blood cells break down when the tissues begin to warm after being iced. The hemoglobin that carry oxygen are passed into the urine because they cannot combine with blood proteins fast enough. °· Numbness or altered sensitivity in the area being iced. °If you have any of the following conditions, do not use ice until you have discussed cryotherapy with your caregiver: °· Heart conditions, such as arrhythmia, angina, or chronic heart disease. °· High blood pressure. °· Healing wounds or open skin in the area being iced. °· Current infections. °· Rheumatoid arthritis. °· Poor circulation. °· Diabetes. °Ice slows the blood flow in the region it is applied. This is beneficial when trying to stop inflamed tissues from spreading irritating chemicals to surrounding tissues. However, if you expose your skin to cold temperatures for too long or without the proper protection, you can damage your skin or nerves. Watch for signs of skin damage due to cold. °HOME CARE INSTRUCTIONS °Follow these tips to use ice and cold packs safely. °· Place a dry or damp towel between the ice and skin. A damp towel will  cool the skin more quickly, so you may need to shorten the time that the ice is used. °· For a more rapid response, add gentle compression to the ice. °· Ice for no more than 10 to 20 minutes at a time. The bonier the area you are icing, the less time it will take to get the benefits of ice. °· Check your skin after 5 minutes to make sure there are no signs of a poor response to cold or skin damage. °· Rest 20 minutes or more between uses. °· Once your skin is numb, you can end your treatment. You can test numbness by very lightly touching your skin. The touch should be so light that you do not see the skin dimple from the pressure of your fingertip. When using ice, most people will feel these normal sensations in this order: cold, burning, aching, and numbness. °· Do not use ice on someone who   cannot communicate their responses to pain, such as small children or people with dementia. °HOW TO MAKE AN ICE PACK °Ice packs are the most common way to use ice therapy. Other methods include ice massage, ice baths, and cryosprays. Muscle creams that cause a cold, tingly feeling do not offer the same benefits that ice offers and should not be used as a substitute unless recommended by your caregiver. °To make an ice pack, do one of the following: °· Place crushed ice or a bag of frozen vegetables in a sealable plastic bag. Squeeze out the excess air. Place this bag inside another plastic bag. Slide the bag into a pillowcase or place a damp towel between your skin and the bag. °· Mix 3 parts water with 1 part rubbing alcohol. Freeze the mixture in a sealable plastic bag. When you remove the mixture from the freezer, it will be slushy. Squeeze out the excess air. Place this bag inside another plastic bag. Slide the bag into a pillowcase or place a damp towel between your skin and the bag. °SEEK MEDICAL CARE IF: °· You develop white spots on your skin. This may give the skin a blotchy (mottled) appearance. °· Your skin turns  blue or pale. °· Your skin becomes waxy or hard. °· Your swelling gets worse. °MAKE SURE YOU:  °· Understand these instructions. °· Will watch your condition. °· Will get help right away if you are not doing well or get worse. °  °This information is not intended to replace advice given to you by your health care provider. Make sure you discuss any questions you have with your health care provider. °  °Document Released: 06/30/2011 Document Revised: 11/24/2014 Document Reviewed: 06/30/2011 °Elsevier Interactive Patient Education ©2016 Elsevier Inc[employer] ° °

## 2015-12-12 NOTE — ED Provider Notes (Signed)
[facility] Emergency Department Provider Note  ____________________________________________  Time seen: Approximately 4:29 PM  I have reviewed the triage vital signs and the nursing notes.   HISTORY  Chief Complaint Ankle Pain    HPI Cynthia Norman is a 35 y.o. female presents for evaluation of right ankle pain 2 days. Patient states that she twisted/rolled and continues to have pain.No relief with over-the-counter medications. Patient states the pain does not radiate anywhere. Has tried elevation and ice with no relief.   History reviewed. No pertinent past medical history.  There are no active problems to display for this patient.   Past Surgical History  Procedure Laterality Date  . Tonsillectomy    . Cesarean section    . Tubal ligation      Current Outpatient Rx  Name  Route  Sig  Dispense  Refill  . HYDROcodone-acetaminophen (NORCO) 5-325 MG tablet   Oral   Take 1-2 tablets by mouth every 4 (four) hours as needed for moderate pain.   15 tablet   0   . ibuprofen (ADVIL,MOTRIN) 800 MG tablet   Oral   Take 1 tablet (800 mg total) by mouth every 8 (eight) hours as needed.   30 tablet   0     Allergies Levaquin  History reviewed. No pertinent family history.  Social History Social History  Substance Use Topics  . Smoking status: Never Smoker   . Smokeless tobacco: Never Used  . Alcohol Use: No    Review of Systems Constitutional: No fever/chills Eyes: No visual changes. ENT: No sore throat. Cardiovascular: Denies chest pain. Respiratory: Denies shortness of breath. Gastrointestinal: No abdominal pain.  No nausea, no vomiting.  No diarrhea.  No constipation. Genitourinary: Negative for dysuria. Musculoskeletal: Positive for right ankle pain. Skin: Negative for rash. Neurological: Negative for headaches, focal weakness or numbness.  10-point ROS otherwise  negative.  ____________________________________________   PHYSICAL EXAM:  VITAL SIGNS: ED Triage Vitals  Enc Vitals Group     BP 12/12/15 1610 127/89 mmHg     Pulse Rate 12/12/15 1610 96     Resp 12/12/15 1610 18     Temp 12/12/15 1610 98.4 F (36.9 C)     Temp Source 12/12/15 1610 Oral     SpO2 12/12/15 1610 100 %     Weight 12/12/15 1610 175 lb (79.379 kg)     Height 12/12/15 1610  (1.651 m)     Head Cir --      Peak Flow --      Pain Score 12/12/15 1612 5     Pain Loc --      Pain Edu? --      Excl. in GC? --     Constitutional: Alert and oriented. Well appearing and in no acute distress. Musculoskeletal: Right foot and ankle with positive tenderness. Distally neurovascularly intact. Limited range of motion with increased pain with flexion and extension and lateralization. Neurologic:  Normal speech and language. No gross focal neurologic deficits are appreciated. No gait instability. Skin:  Skin is warm, dry and intact. No rash noted. Psychiatric: Mood and affect are normal. Speech and behavior are normal.  ____________________________________________   LABS (all labs ordered are listed, but only abnormal results are displayed)  Labs Reviewed - No data to display  RADIOLOGY  Negative for any acute osseous findings ____________________________________________   PROCEDURES  Procedure(s) performed: None  Critical Care performed: No  ____________________________________________   INITIAL IMPRESSION / ASSESSMENT AND PLAN /  ED COURSE  Pertinent labs & imaging results that were available during my care of the patient were reviewed by me and considered in my medical decision making (see chart for details).  Acute left ankle sprain. Rx given for Motrin 800 mg 3 times a day in a stirrup splint. Patient follow-up with PCP or Dr. Rosita Kea. Patient voices no other emergency medical complaints at this time. ____________________________________________   FINAL  CLINICAL IMPRESSION(S) / ED DIAGNOSES  Final diagnoses:  Ankle sprain, left, initial encounter      Evangeline Dakin, PA-C 12/12/15 1724  Governor Rooks, MD 12/12/15 1745

## 2015-12-12 NOTE — ED Notes (Signed)
Pt reports 2 days ago stepped and rolled ankle. Remains to have pain to lateral and medial ankle.

## 2016-01-16 ENCOUNTER — Emergency Department
Admission: EM | Admit: 2016-01-16 | Discharge: 2016-01-16 | Disposition: A | Discharge status: Home / Self Care | Payer: Self-pay | Attending: Emergency Medicine | Admitting: Emergency Medicine

## 2016-01-16 ENCOUNTER — Encounter: Payer: Self-pay | Admitting: Emergency Medicine

## 2016-01-16 DIAGNOSIS — J101 Influenza due to other identified influenza virus with other respiratory manifestations: Secondary | ICD-10-CM | POA: Insufficient documentation

## 2016-01-16 LAB — RAPID INFLUENZA A&B ANTIGENS
Influenza A (ARMC): POSITIVE — AB
Influenza B (ARMC): NEGATIVE

## 2016-01-16 LAB — POCT RAPID STREP A: Streptococcus, Group A Screen (Direct): NEGATIVE

## 2016-01-16 MED ORDER — FLUTICASONE PROPIONATE 50 MCG/ACT NA SUSP: 2.0000 | Freq: Every day | NASAL | Status: AC

## 2016-01-16 MED ORDER — OSELTAMIVIR PHOSPHATE 75 MG PO CAPS: 75.0000 mg | ORAL_CAPSULE | Freq: Two times a day (BID) | ORAL | Status: AC

## 2016-01-16 MED ORDER — PSEUDOEPH-BROMPHEN-DM 30-2-10 MG/5ML PO SYRP
10.0000 mL | ORAL_SOLUTION | Freq: Four times a day (QID) | ORAL | Status: AC | PRN
Start: 2016-01-16 — End: ?

## 2016-01-16 NOTE — ED Provider Notes (Signed)
[facility] Emergency Department Provider Note  ____________________________________________  Time seen: Approximately 3:37 PM  I have reviewed the triage vital signs and the nursing notes.   HISTORY  Chief Complaint URI    HPI Cynthia Norman is a 35 y.o. female , NAD, presents to the emergency department with 3 day history of sudden onset of nasal congestion, runny nose, cough, fever, body aches. Notes everyone in her household has had similar symptoms. Son was recently diagnosed with bronchitis. Has no other known sick contacts. Has taken over-the-counter medications with minimal improvement. Has been able to eat and drink well without any nausea, vomiting, diarrhea.   History reviewed. No pertinent past medical history.  There are no active problems to display for this patient.   Past Surgical History  Procedure Laterality Date  . Tonsillectomy    . Cesarean section    . Tubal ligation      Current Outpatient Rx  Name  Route  Sig  Dispense  Refill  . brompheniramine-pseudoephedrine-DM 30-2-10 MG/5ML syrup   Oral   Take 10 mLs by mouth 4 (four) times daily as needed.   200 mL   0   . fluticasone (FLONASE) 50 MCG/ACT nasal spray   Each Nare   Place 2 sprays into both nostrils daily.   16 g   0   . HYDROcodone-acetaminophen (NORCO) 5-325 MG tablet   Oral   Take 1-2 tablets by mouth every 4 (four) hours as needed for moderate pain.   15 tablet   0   . ibuprofen (ADVIL,MOTRIN) 800 MG tablet   Oral   Take 1 tablet (800 mg total) by mouth every 8 (eight) hours as needed.   30 tablet   0   . oseltamivir (TAMIFLU) 75 MG capsule   Oral   Take 1 capsule (75 mg total) by mouth 2 (two) times daily.   10 capsule   0     Allergies Levaquin  No family history on file.  Social History Social History  Substance Use Topics  . Smoking status: Never Smoker   . Smokeless tobacco: Never Used  . Alcohol Use: No     Review of Systems   Constitutional: Positive fever/chills, fatigue Eyes: No visual changes. No discharge ENT: Positive nasal congestion, runny nose, sneezing, sore throat. Cardiovascular: No chest pain. Respiratory: Positive chest congestion, cough. No shortness of breath. No wheezing.  Gastrointestinal: No abdominal pain.  No nausea, vomiting.  No diarrhea.   Musculoskeletal: Positive general myalgias.  Skin: Negative for rash. Neurological: Negative for headaches, focal weakness or numbness. 10-point ROS otherwise negative.  ____________________________________________   PHYSICAL EXAM:  VITAL SIGNS: ED Triage Vitals  Enc Vitals Group     BP 01/16/16 1406 115/74 mmHg     Pulse Rate 01/16/16 1406 115     Resp 01/16/16 1406 18     Temp 01/16/16 1406 101.2 F (38.4 C)     Temp Source 01/16/16 1406 Oral     SpO2 01/16/16 1406 99 %     Weight 01/16/16 1403 180 lb (81.647 kg)     Height 01/16/16 1403  (1.651 m)     Head Cir --      Peak Flow --      Pain Score 01/16/16 1403 8     Pain Loc --      Pain Edu? --      Excl. in GC? --     Constitutional: Alert and oriented. Ill appearing  and in no acute distress. Eyes: Conjunctivae are normal. PERRL. EOMI without pain.  Head: Atraumatic. ENT:      Ears: TMs visualized bilaterally with mild serous effusion but no bulging, erythema, perforation.      Nose: Moderate congestion with moderate clear rhinnorhea.      Mouth/Throat: Mucous membranes are moist.  Neck: No stridor.  No cervical spine tenderness to palpation. Supple with FROM. Hematological/Lymphatic/Immunilogical: No cervical lymphadenopathy. Cardiovascular: Normal rate, regular rhythm. Normal S1 and S2.  Good peripheral circulation. Respiratory: Normal respiratory effort without tachypnea or retractions. Lungs CTAB. Neurologic:  Normal speech and language. No gross focal neurologic deficits are appreciated.  Skin:  Skin is warm, dry and intact. No rash noted. Psychiatric: Mood and  affect are normal. Speech and behavior are normal. Patient exhibits appropriate insight and judgement.   ____________________________________________   LABS (all labs ordered are listed, but only abnormal results are displayed)  Labs Reviewed  RAPID INFLUENZA A&B ANTIGENS ([facility] ONLY) - Abnormal; Notable for the following:    Influenza A ([facility]) POSITIVE (*)    All other components within normal limits  CULTURE, GROUP A STREP [facility])  POCT RAPID STREP A   ____________________________________________  EKG  None ____________________________________________  RADIOLOGY  None ____________________________________________    PROCEDURES  Procedure(s) performed: None    Medications - No data to display   ____________________________________________   INITIAL IMPRESSION / ASSESSMENT AND PLAN / ED COURSE  Pertinent lab results that were available during my care of the patient were reviewed by me and considered in my medical decision making (see chart for details).  Patient's diagnosis is consistent with influenza A infection. Patient will be discharged home with prescriptions for Tamiflu, Bromfed-DM and Flonase to use as prescribed. Should alternate Tylenol and ibuprofen as needed for aches and fever. Patient is to follow up with primary care provider or [facility] if symptoms persist past this treatment course. Patient is given ED precautions to return to the ED for any worsening or new symptoms.    ____________________________________________  FINAL CLINICAL IMPRESSION(S) / ED DIAGNOSES  Final diagnoses:  Influenza A      NEW MEDICATIONS STARTED DURING THIS VISIT:  New Prescriptions   BROMPHENIRAMINE-PSEUDOEPHEDRINE-DM 30-2-10 MG/5ML SYRUP    Take 10 mLs by mouth 4 (four) times daily as needed.   FLUTICASONE (FLONASE) 50 MCG/ACT NASAL SPRAY    Place 2 sprays into both nostrils daily.   OSELTAMIVIR (TAMIFLU) 75 MG CAPSULE    Take 1 capsule (75 mg total) by  mouth 2 (two) times daily.         Hope Pigeon, PA-C 01/16/16 1546  Rockne Menghini, MD 01/16/16 [place] [place]

## 2016-01-16 NOTE — ED Notes (Signed)
Pt states cough, cold, fever, chills, body aches, headache.  Thinks she might have the flu.

## 2016-01-16 NOTE — ED Notes (Signed)
States she developed fever   Cough and body aches a couple of days ago    Also having sore throat

## 2016-01-16 NOTE — Discharge Instructions (Signed)

## 2016-01-18 LAB — CULTURE, GROUP A STREP (THRC)

## 2016-02-08 IMAGING — CR DG ELBOW COMPLETE 3+V*L*
1 series · 4 of 4 positions shown · non-contrast
Comparison: None.

CLINICAL DATA: Fall with left elbow injury and pain. Initial
encounter.

EXAM:
LEFT ELBOW - COMPLETE 3+ VIEW

[Series 1: dg elbow complete left · 0.14mm/px · 4 of 4 slices shown]
[im 1/4]
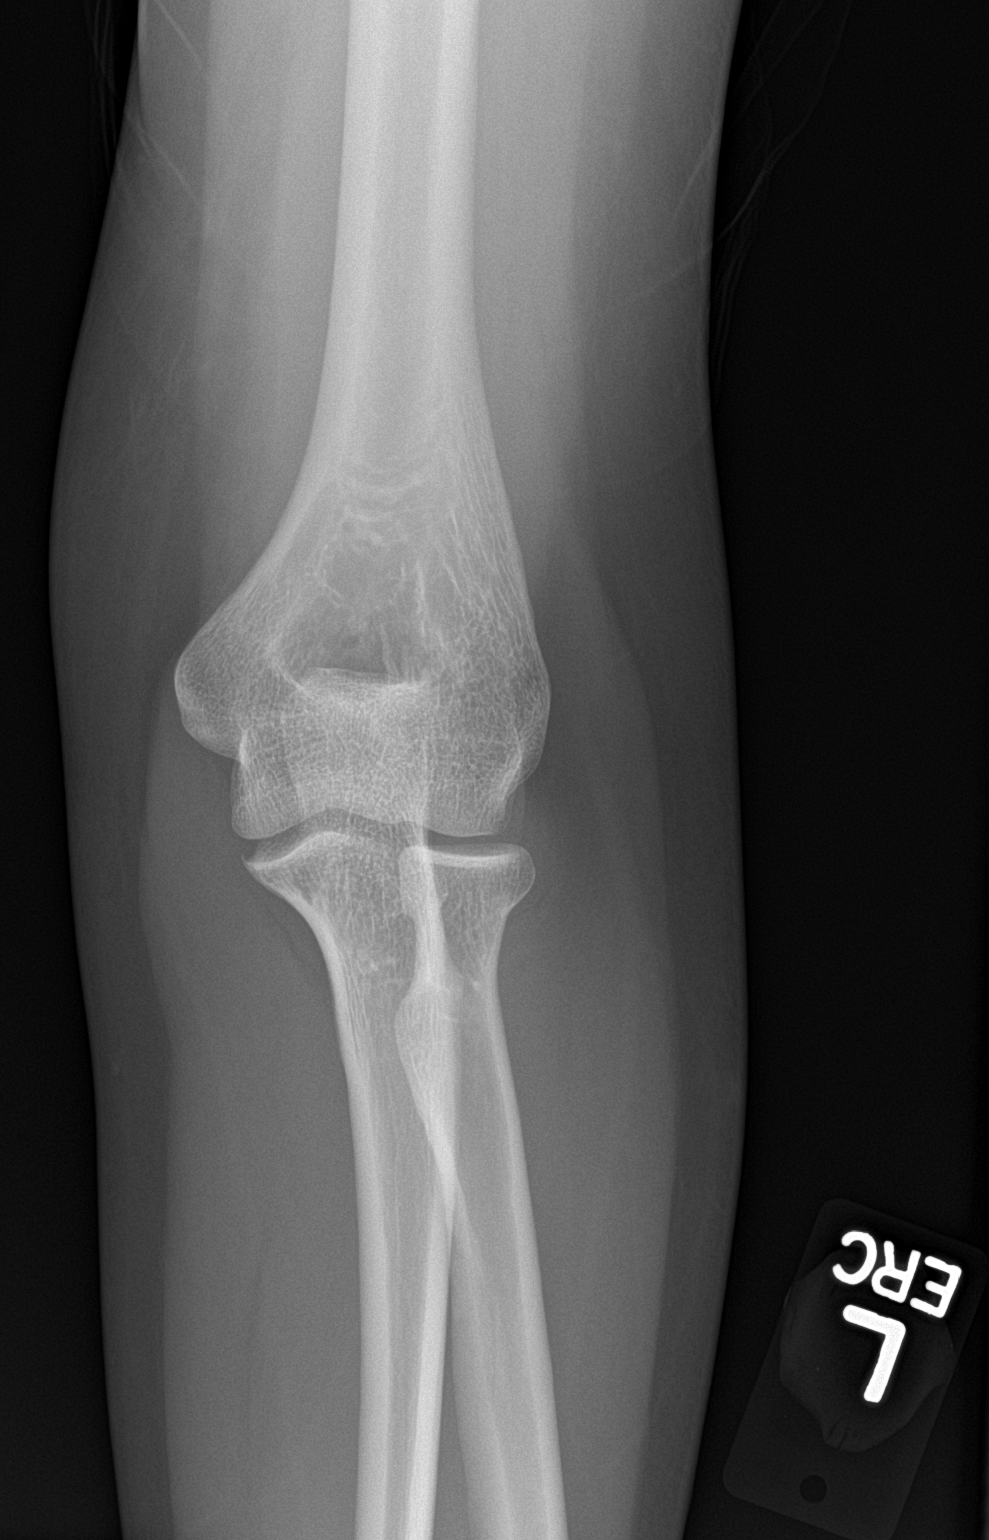
[im 2/4]
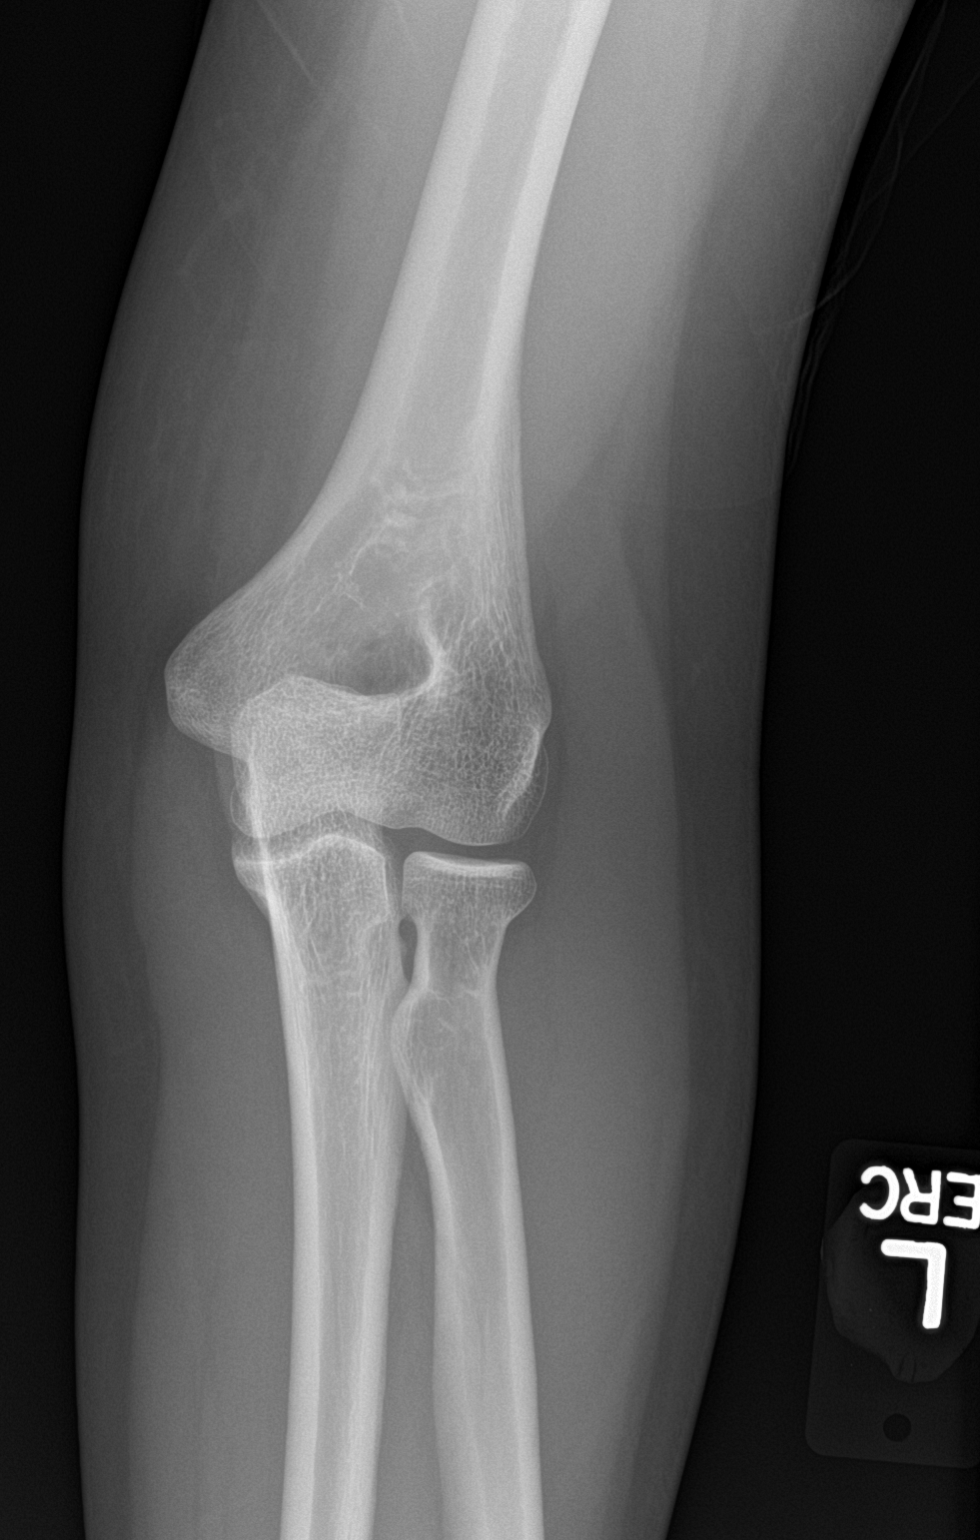
[im 3/4]
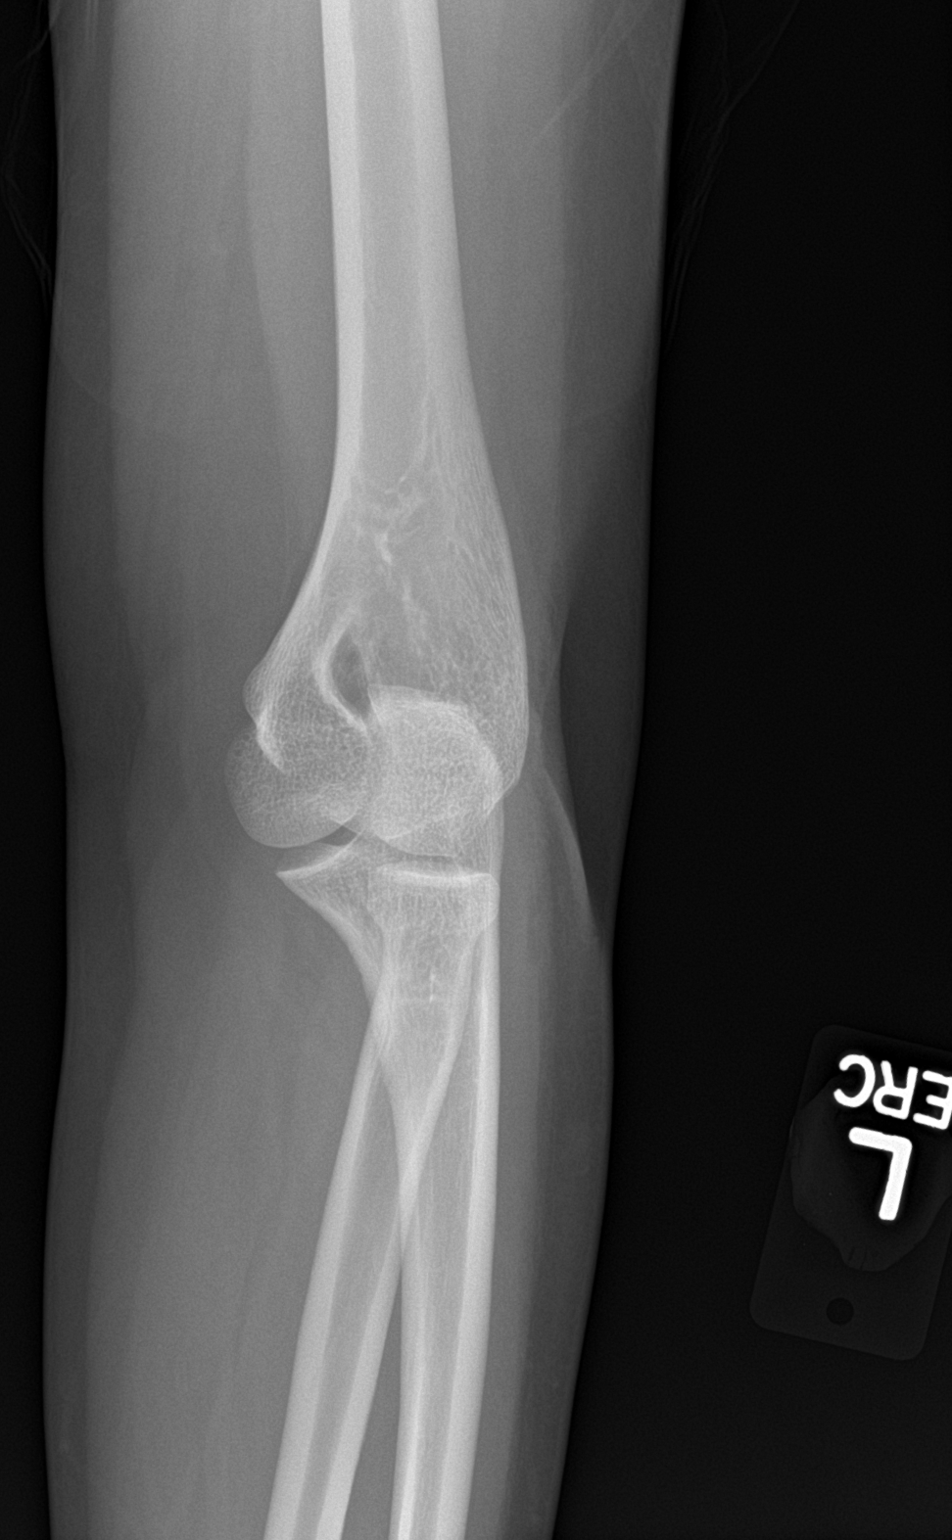
[im 4/4]
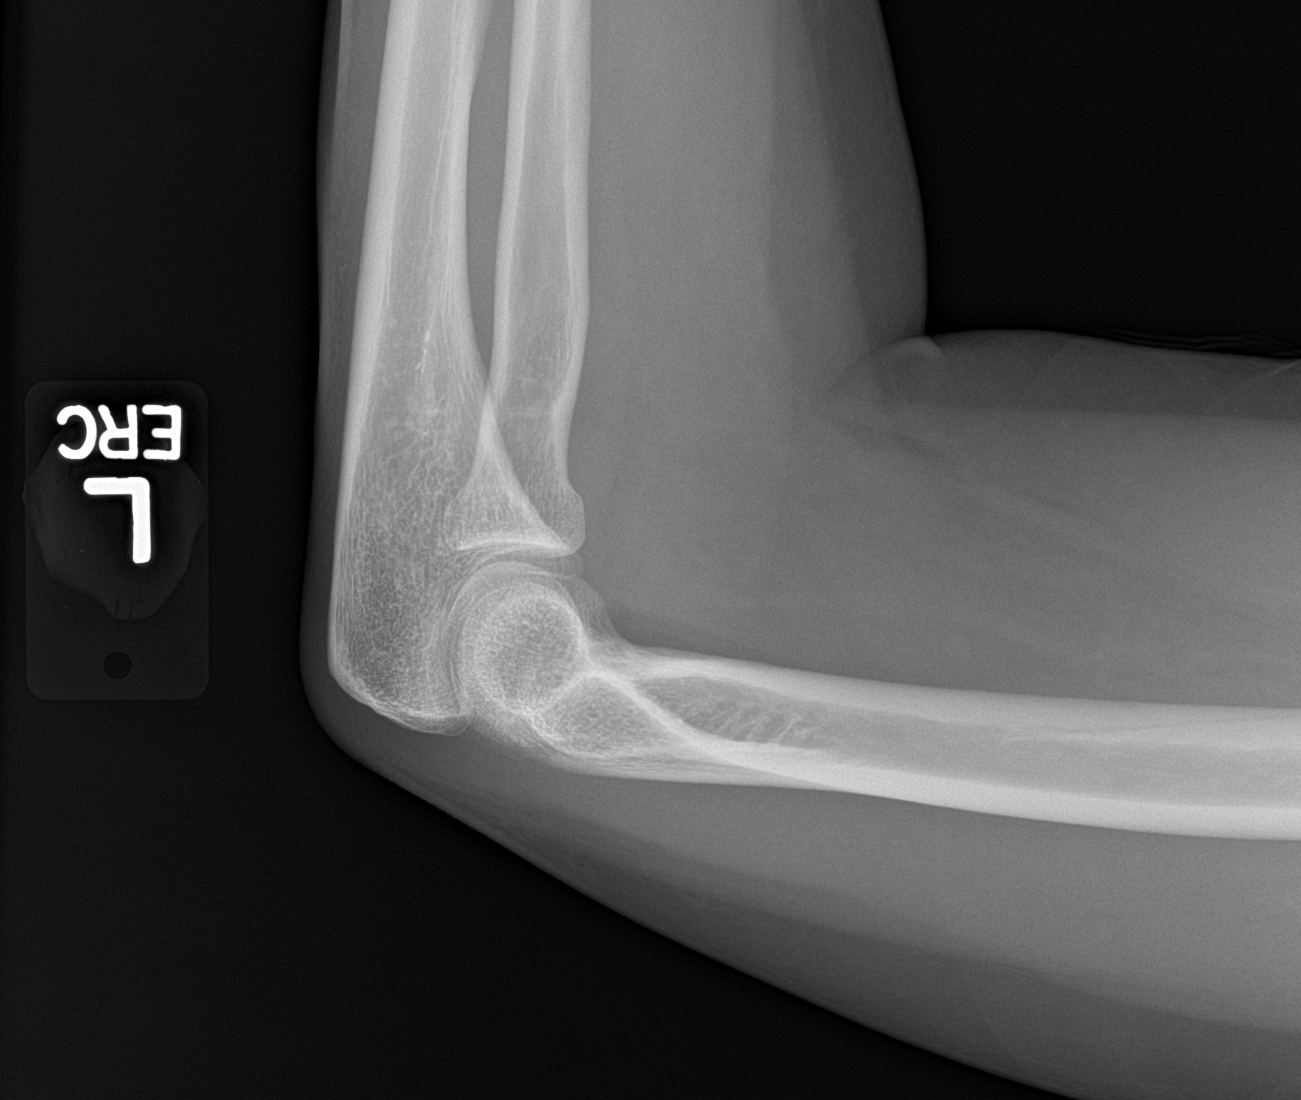

[4 of 4 positions shown; findings below may reference images not displayed]

FINDINGS: There is no evidence of fracture, dislocation, or joint effusion.
There is no evidence of arthropathy or other focal bone abnormality.
Soft tissues are unremarkable.
IMPRESSION: Negative.

## 2016-03-24 ENCOUNTER — Emergency Department
Admission: EM | Admit: 2016-03-24 | Discharge: 2016-03-24 | Disposition: A | Discharge status: Home / Self Care | Payer: Self-pay | Attending: Emergency Medicine | Admitting: Emergency Medicine

## 2016-03-24 ENCOUNTER — Encounter: Payer: Self-pay | Admitting: Emergency Medicine

## 2016-03-24 ENCOUNTER — Emergency Department: Payer: Self-pay

## 2016-03-24 DIAGNOSIS — J9801 Acute bronchospasm: Secondary | ICD-10-CM | POA: Insufficient documentation

## 2016-03-24 DIAGNOSIS — Z7951 Long term (current) use of inhaled steroids: Secondary | ICD-10-CM | POA: Insufficient documentation

## 2016-03-24 DIAGNOSIS — Z79899 Other long term (current) drug therapy: Secondary | ICD-10-CM | POA: Insufficient documentation

## 2016-03-24 MED ORDER — HYDROCOD POLST-CPM POLST ER 10-8 MG/5ML PO SUER: 5.0000 mL | Freq: Two times a day (BID) | ORAL | Status: AC

## 2016-03-24 MED ORDER — METHYLPREDNISOLONE 4 MG PO TBPK: ORAL_TABLET | ORAL | Status: AC

## 2016-03-24 NOTE — Discharge Instructions (Signed)
Bronchospasm, Adult A bronchospasm is when the tubes that carry air in and out of your lungs (airways) spasm or tighten. During a bronchospasm it is hard to breathe. This is because the airways get smaller. A bronchospasm can be triggered by:  Allergies. These may be to animals, pollen, food, or mold.  Infection. This is a common cause of bronchospasm.  Exercise.  Irritants. These include pollution, cigarette smoke, strong odors, aerosol sprays, and paint fumes.  Weather changes.  Stress.  Being emotional. HOME CARE   Always have a plan for getting help. Know when to call your doctor and local emergency services (911 in the [place] Know where you can get emergency care.  Only take medicines as told by your doctor.  If you were prescribed an inhaler or nebulizer machine, ask your doctor how to use it correctly. Always use a spacer with your inhaler if you were given one.  Stay calm during an attack. Try to relax and breathe more slowly.  Control your home environment:  Change your heating and air conditioning filter at least once a month.  Limit your use of fireplaces and wood stoves.  Do not  smoke. Do not  allow smoking in your home.  Avoid perfumes and fragrances.  Get rid of pests (such as roaches and mice) and their droppings.  Throw away plants if you see mold on them.  Keep your house clean and dust free.  Replace carpet with wood, tile, or vinyl flooring. Carpet can trap dander and dust.  Use allergy-proof pillows, mattress covers, and box spring covers.  Wash bed sheets and blankets every week in hot water. Dry them in a dryer.  Use blankets that are made of polyester or cotton.  Wash hands frequently. GET HELP IF:  You have muscle aches.  You have chest pain.  The thick spit you spit or cough up (sputum) changes from clear or white to yellow, green, gray, or bloody.  The thick spit you spit or cough up gets thicker.  There are problems that may be  related to the medicine you are given such as:  A rash.  Itching.  Swelling.  Trouble breathing. GET HELP RIGHT AWAY IF:  You feel you cannot breathe or catch your breath.  You cannot stop coughing.  Your treatment is not helping you breathe better.  You have very bad chest pain. MAKE SURE YOU:   Understand these instructions.  Will watch your condition.  Will get help right away if you are not doing well or get worse.   This information is not intended to replace advice given to you by your health care provider. Make sure you discuss any questions you have with your health care provider.   Document Released: 08/31/2009 Document Revised: 11/24/2014 Document Reviewed: 04/26/2013 Elsevier Interactive Patient Education 2016 Elsevier Inc.  

## 2016-03-24 NOTE — ED Provider Notes (Signed)
[facility] Emergency Department Provider Note   ____________________________________________  Time seen: Approximately 8:40 AM  I have reviewed the triage vital signs and the nursing notes.   HISTORY  Chief Complaint Cough   HPI Cynthia Norman is a 35 y.o. female complaining of a cough x 4-6 weeks. Patient states the cough first began when she had the flu about a month ago and has continued since although her other symptoms such as fevers, chills and myalgias,  seem to have been relieved. She describes the cough as constant and dry. At times she feels as though it is productive but cannot produce anything with coughing. She admits to hemoptysis one week ago and states this has happened before while she was sick with a URI and was told she had "ruptured something in her esophagus." She admits to sore throat, chest tightness that does not radiate, nasal congestion, HA and DOE. She denies numbness/tingling in the extremities, nausea, vomiting, diarrhea, abdominal pain, changes in vision, or pain in the lower extremities. She does not find the cough to be worse at any specific time of day but finds it to worsen with activity.    History reviewed. No pertinent past medical history.  There are no active problems to display for this patient.   Past Surgical History  Procedure Laterality Date  . Tonsillectomy    . Cesarean section    . Tubal ligation      Current Outpatient Rx  Name  Route  Sig  Dispense  Refill  . brompheniramine-pseudoephedrine-DM 30-2-10 MG/5ML syrup   Oral   Take 10 mLs by mouth 4 (four) times daily as needed.   200 mL   0   . chlorpheniramine-HYDROcodone (TUSSIONEX PENNKINETIC ER) 10-8 MG/5ML SUER   Oral   Take 5 mLs by mouth 2 (two) times daily.   115 mL   0   . fluticasone (FLONASE) 50 MCG/ACT nasal spray   Each Nare   Place 2 sprays into both nostrils daily.   16 g   0   . HYDROcodone-acetaminophen (NORCO) 5-325 MG  tablet   Oral   Take 1-2 tablets by mouth every 4 (four) hours as needed for moderate pain.   15 tablet   0   . ibuprofen (ADVIL,MOTRIN) 800 MG tablet   Oral   Take 1 tablet (800 mg total) by mouth every 8 (eight) hours as needed.   30 tablet   0   . methylPREDNISolone (MEDROL DOSEPAK) 4 MG TBPK tablet      Take Tapered dose as directed   21 tablet   0   . oseltamivir (TAMIFLU) 75 MG capsule   Oral   Take 1 capsule (75 mg total) by mouth 2 (two) times daily.   10 capsule   0     Allergies Levaquin  No family history on file.  Social History Social History  Substance Use Topics  . Smoking status: Never Smoker   . Smokeless tobacco: Never Used  . Alcohol Use: No    Review of Systems Constitutional: No fever/chills Eyes: No visual changes. ENT: Positive for sore throat, nasal congestion and neck tenderness Cardiovascular: Positive for chest pain. Negative for palpitations Respiratory: Denies shortness of breath. Positive for DOE and cough Gastrointestinal: No abdominal pain.  No nausea, no vomiting.  No diarrhea.  No constipation. Musculoskeletal: Negative for back pain. Skin: Negative for rash. Neurological: Negative for focal weakness or numbness, positive for HA  10-point ROS otherwise negative.  ____________________________________________   PHYSICAL EXAM:  VITAL SIGNS: ED Triage Vitals  Enc Vitals Group     BP 03/24/16 0805 125/85 mmHg     Pulse Rate 03/24/16 0805 77     Resp 03/24/16 0805 18     Temp 03/24/16 0805 97.7 F (36.5 C)     Temp Source 03/24/16 0805 Oral     SpO2 03/24/16 0805 100 %     Weight 03/24/16 0805 174 lb (78.926 kg)     Height 03/24/16 0805  (1.651 m)     Head Cir --      Peak Flow --      Pain Score 03/24/16 0806 5     Pain Loc --      Pain Edu? --      Excl. in GC? --     Constitutional: Alert and oriented. Well appearing and in no acute distress. Eyes: Conjunctivae are normal. PERRL. EOMI. Head:  Atraumatic. Nose: Mild congestion/rhinnorhea. Mouth/Throat: Mucous membranes are moist.  Oropharynx mildly erythematous. Uvula midline Neck: No stridor.  No cervical spine tenderness to palpation. Hematological/Lymphatic/Immunilogical: Mild cervical lymphadenopathy. Cardiovascular: Normal rate, regular rhythm. Grossly normal heart sounds.  Good peripheral circulation. Respiratory: Normal respiratory effort.  No retractions. Lungs mild wheezing. Musculoskeletal: No lower extremity tenderness nor edema.  No joint effusions. Chest tender to palpation Neurologic:  Normal speech and language. No gross focal neurologic deficits are appreciated. No gait instability. Skin:  Skin is warm, dry and intact. No rash noted. Psychiatric: Mood and affect are normal. Speech and behavior are normal.  ____________________________________________   LABS (all labs ordered are listed, but only abnormal results are displayed)  Labs Reviewed - No data to display ____________________________________________  EKG   ____________________________________________  RADIOLOGY  No acute findings on chest x-ray ____________________________________________   PROCEDURES  Procedure(s) performed: None  Critical Care performed: No  ____________________________________________   INITIAL IMPRESSION / ASSESSMENT AND PLAN / ED COURSE  Pertinent labs & imaging results that were available during my care of the patient were reviewed by me and considered in my medical decision making (see chart for details).  Cough due to bronchospasm. Patient given discharge Instructions. Patient given a prescription for Tussionex and Medrol Dosepak. Patient advised follow "clinic if condition persists. ____________________________________________   FINAL CLINICAL IMPRESSION(S) / ED DIAGNOSES  Final diagnoses:  Cough due to bronchospasm      NEW MEDICATIONS STARTED DURING THIS VISIT:  New Prescriptions    CHLORPHENIRAMINE-HYDROCODONE (TUSSIONEX PENNKINETIC ER) 10-8 MG/5ML SUER    Take 5 mLs by mouth 2 (two) times daily.   METHYLPREDNISOLONE (MEDROL DOSEPAK) 4 MG TBPK TABLET    Take Tapered dose as directed     Note:  This document was prepared using Dragon voice recognition software and may include unintentional dictation errors.   Joni Reining, PA-C 03/24/16 [id number]  Governor Rooks, MD 03/24/16 [phone]

## 2016-03-24 NOTE — ED Notes (Signed)
States she had the flu about 1 month ago   Sx's eased off and then returned about 1 week ago  Unsure of fever but has had intermittent hot and cold flashes  Cough is strong and non prod

## 2016-03-24 NOTE — ED Notes (Signed)
Here with cough for a week now. Appears in no distress. VSS.

## 2016-04-23 ENCOUNTER — Emergency Department
Admission: EM | Admit: 2016-04-23 | Discharge: 2016-04-24 | Disposition: A | Discharge status: Home / Self Care | Payer: Self-pay | Attending: Emergency Medicine | Admitting: Emergency Medicine

## 2016-04-23 DIAGNOSIS — R110 Nausea: Secondary | ICD-10-CM | POA: Insufficient documentation

## 2016-04-23 DIAGNOSIS — R1011 Right upper quadrant pain: Secondary | ICD-10-CM | POA: Insufficient documentation

## 2016-04-23 DIAGNOSIS — R109 Unspecified abdominal pain: Secondary | ICD-10-CM

## 2016-04-23 DIAGNOSIS — N644 Mastodynia: Secondary | ICD-10-CM | POA: Insufficient documentation

## 2016-04-23 LAB — URINALYSIS COMPLETE WITH MICROSCOPIC (ARMC ONLY)
Bilirubin Urine: NEGATIVE
GLUCOSE, UA: NEGATIVE mg/dL
HGB URINE DIPSTICK: NEGATIVE
Ketones, ur: NEGATIVE mg/dL
Nitrite: NEGATIVE
PROTEIN: NEGATIVE mg/dL
SPECIFIC GRAVITY, URINE: 1.02 (ref 1.005–1.030)
pH: 5 (ref 5.0–8.0)

## 2016-04-23 LAB — LIPASE, BLOOD: Lipase: 52 U/L — ABNORMAL HIGH (ref 11–51)

## 2016-04-23 LAB — CBC
HEMATOCRIT: 38.7 % (ref 35.0–47.0)
Hemoglobin: 13 g/dL (ref 12.0–16.0)
MCH: 29.7 pg (ref 26.0–34.0)
MCHC: 33.4 g/dL (ref 32.0–36.0)
MCV: 88.8 fL (ref 80.0–100.0)
Platelets: 259 10*3/uL (ref 150–440)
RBC: 4.37 MIL/uL (ref 3.80–5.20)
RDW: 14.9 % — AB (ref 11.5–14.5)
WBC: 10.1 10*3/uL (ref 3.6–11.0)

## 2016-04-23 LAB — COMPREHENSIVE METABOLIC PANEL
ALBUMIN: 4.6 g/dL (ref 3.5–5.0)
ALT: 13 U/L — AB (ref 14–54)
AST: 15 U/L (ref 15–41)
Alkaline Phosphatase: 57 U/L (ref 38–126)
Anion gap: 8 (ref 5–15)
BILIRUBIN TOTAL: 0.2 mg/dL — AB (ref 0.3–1.2)
BUN: 9 mg/dL (ref 6–20)
CO2: 24 mmol/L (ref 22–32)
CREATININE: 0.9 mg/dL (ref 0.44–1.00)
Calcium: 9.3 mg/dL (ref 8.9–10.3)
Chloride: 107 mmol/L (ref 101–111)
GFR calc Af Amer: >60 mL/min (ref >60)
GLUCOSE: 105 mg/dL — AB (ref 65–99)
Potassium: 3.4 mmol/L — ABNORMAL LOW (ref 3.5–5.1)
Sodium: 139 mmol/L (ref 135–145)
TOTAL PROTEIN: 8.4 g/dL — AB (ref 6.5–8.1)

## 2016-04-23 LAB — POCT PREGNANCY, URINE: PREG TEST UR: NEGATIVE

## 2016-04-23 NOTE — ED Notes (Signed)
Pt arrives to ER via POV c/o bilateral breast fullness, nausea, diarrhea X 7 days. Pt alert and oriented X4, active, cooperative, pt in NAD. RR even and unlabored, color WNL.

## 2016-04-24 ENCOUNTER — Emergency Department: Payer: Self-pay

## 2016-04-24 LAB — HCG, QUANTITATIVE, PREGNANCY

## 2016-04-24 NOTE — ED Provider Notes (Signed)
[facility] Emergency Department Provider Note  ____________________________________________  Time seen: 12:30 AM  I have reviewed the triage vital signs and the nursing notes.   HISTORY  Chief Complaint Nausea and Breast Pain    HPI Cynthia Norman is a 35 y.o. female presents with multiple medical complaints including bilateral leg swelling generalized abdominal pain nausea and breast tenderness with very light touch (T-shirt touching her breasts), headache. Patient denies any vomiting or diarrhea stated that she had diarrhea 7 days ago which is since resolved. Patient denies any fever patient denies any weakness numbness gait instability or visual changes. Patient denies any chest pain or shortness of breath. No history DVT or PE.    Past medical history No pertinent past medical history There are no active problems to display for this patient.   Past Surgical History  Procedure Laterality Date  . Tonsillectomy    . Cesarean section    . Tubal ligation      Current Outpatient Rx  Name  Route  Sig  Dispense  Refill  . brompheniramine-pseudoephedrine-DM 30-2-10 MG/5ML syrup   Oral   Take 10 mLs by mouth 4 (four) times daily as needed.   200 mL   0   . chlorpheniramine-HYDROcodone (TUSSIONEX PENNKINETIC ER) 10-8 MG/5ML SUER   Oral   Take 5 mLs by mouth 2 (two) times daily.   115 mL   0   . fluticasone (FLONASE) 50 MCG/ACT nasal spray   Each Nare   Place 2 sprays into both nostrils daily.   16 g   0   . HYDROcodone-acetaminophen (NORCO) 5-325 MG tablet   Oral   Take 1-2 tablets by mouth every 4 (four) hours as needed for moderate pain.   15 tablet   0   . ibuprofen (ADVIL,MOTRIN) 800 MG tablet   Oral   Take 1 tablet (800 mg total) by mouth every 8 (eight) hours as needed.   30 tablet   0   . methylPREDNISolone (MEDROL DOSEPAK) 4 MG TBPK tablet      Take Tapered dose as directed   21 tablet   0   . oseltamivir (TAMIFLU) 75 MG  capsule   Oral   Take 1 capsule (75 mg total) by mouth 2 (two) times daily.   10 capsule   0     Allergies Levaquin  No family history on file.  Social History Social History  Substance Use Topics  . Smoking status: Never Smoker   . Smokeless tobacco: Never Used  . Alcohol Use: No    Review of Systems  Constitutional: Negative for fever. Eyes: Negative for visual changes. ENT: Negative for sore throat. Cardiovascular: Negative for chest pain. Respiratory: Negative for shortness of breath. Gastrointestinal: Positive for abdominal pain, positive for nausea Genitourinary: Negative for dysuria. Musculoskeletal: Positive for back pain Skin: Negative for rash. Neurological: Positive for headache   10-point ROS otherwise negative.  ____________________________________________   PHYSICAL EXAM:  VITAL SIGNS: ED Triage Vitals  Enc Vitals Group     BP 04/23/16 2101 146/89 mmHg     Pulse Rate 04/23/16 2101 84     Resp 04/23/16 2101 14     Temp 04/23/16 2101 98.2 F (36.8 C)     Temp Source 04/23/16 2101 Oral     SpO2 04/23/16 2101 100 %     Weight 04/23/16 2101 175 lb (79.379 kg)     Height --      Head Cir --  Peak Flow --      Pain Score 04/23/16 2102 4     Pain Loc --      Pain Edu? --      Excl. in GC? --      Constitutional: Alert and oriented. Well appearing and in no distress. Eyes: Conjunctivae are normal. PERRL. Normal extraocular movements. ENT   Head: Normocephalic and atraumatic.   Nose: No congestion/rhinnorhea.   Mouth/Throat: Mucous membranes are moist.   Neck: No stridor. Breast exam: Unremarkable exam of the breasts with exception of pain with palpation of the 5 to 7:00 position on the right likewise on the left with no mass or overlying erythema noted Hematological/Lymphatic/Immunilogical: No cervical lymphadenopathy. Cardiovascular: Normal rate, regular rhythm. Normal and symmetric distal pulses are present in all  extremities. No murmurs, rubs, or gallops. Respiratory: Normal respiratory effort without tachypnea nor retractions. Breath sounds are clear and equal bilaterally. No wheezes/rales/rhonchi. Gastrointestinal: Soft and nontender. No distention. There is no CVA tenderness. Genitourinary: deferred Musculoskeletal: Nontender with normal range of motion in all extremities. No joint effusions.  No lower extremity tenderness nor edema. Neurologic:  Normal speech and language. No gross focal neurologic deficits are appreciated. Speech is normal.  Skin:  Skin is warm, dry and intact. No rash noted. Psychiatric: Mood and affect are normal. Speech and behavior are normal. Patient exhibits appropriate insight and judgment.  ____________________________________________    LABS (pertinent positives/negatives)  Labs Reviewed  LIPASE, BLOOD - Abnormal; Notable for the following:    Lipase 52 (*)    All other components within normal limits  COMPREHENSIVE METABOLIC PANEL - Abnormal; Notable for the following:    Potassium 3.4 (*)    Glucose, Bld 105 (*)    Total Protein 8.4 (*)    ALT 13 (*)    Total Bilirubin 0.2 (*)    All other components within normal limits  CBC - Abnormal; Notable for the following:    RDW 14.9 (*)    All other components within normal limits  URINALYSIS COMPLETEWITH MICROSCOPIC ([facility] ONLY) - Abnormal; Notable for the following:    Color, Urine YELLOW (*)    APPearance HAZY (*)    Leukocytes, UA 3+ (*)    Bacteria, UA FEW (*)    Squamous Epithelial / LPF 0-5 (*)    All other components within normal limits  HCG, QUANTITATIVE, PREGNANCY  POC URINE PREG, ED  POCT PREGNANCY, URINE       RADIOLOGY  US Abdomen Limited RUQ (Final result) Result time: 04/24/16 01:38:23   Final result by Rad Results In Interface (04/24/16 01:38:23)   Narrative:   CLINICAL DATA: 35 year old female with right upper quadrant abdominal pain  EXAM: US ABDOMEN LIMITED - RIGHT UPPER  QUADRANT  COMPARISON: None.  FINDINGS: Gallbladder:  The gallbladder is predominantly contracted and appears unremarkable. There is no pericholecystic fluid.  Common bile duct:  Diameter: 3 mm  Liver:  No focal lesion identified. Within normal limits in parenchymal echogenicity.  IMPRESSION: Unremarkable right upper quadrant ultrasound.   Electronically Signed By: Elgie Collard M.D. On: 04/24/2016 01:38        INITIAL IMPRESSION / ASSESSMENT AND PLAN / ED COURSE  Pertinent labs & imaging results that were available during my care of the patient were reviewed by me and considered in my medical decision making (see chart for details).  No clear etiology for the patient's multiple medical complaints identified at this time. Ultrasound of the abdomen revealed unremarkable right upper quadrant ultrasound  laboratory data unremarkable with the exception of 3+ leukocytes in her urine as such urine culture was sent. Patient referred to primary care provider for further outpatient evaluation  ____________________________________________   FINAL CLINICAL IMPRESSION(S) / ED DIAGNOSES  Final diagnoses:  Right upper quadrant abdominal pain  Nausea  Abdominal pain, unspecified abdominal location  Breast tenderness in female      Darci Current, MD 04/24/16 0202

## 2016-04-24 NOTE — ED Notes (Signed)
Pt c/o breast sensitivity, bilateral foot swelling, abdominal pain, lower back pain.

## 2016-04-24 NOTE — Discharge Instructions (Signed)
Abdominal Pain, Adult °Many things can cause abdominal pain. Usually, abdominal pain is not caused by a disease and will improve without treatment. It can often be observed and treated at home. Your health care provider will do a physical exam and possibly order blood tests and X-rays to help determine the seriousness of your pain. However, in many cases, more time must pass before a clear cause of the pain can be found. Before that point, your health care provider may not know if you need more testing or further treatment. °HOME CARE INSTRUCTIONS °Monitor your abdominal pain for any changes. The following actions may help to alleviate any discomfort you are experiencing: °· Only take over-the-counter or prescription medicines as directed by your health care provider. °· Do not take laxatives unless directed to do so by your health care provider. °· Try a clear liquid diet (broth, tea, or water) as directed by your health care provider. Slowly move to a bland diet as tolerated. °SEEK MEDICAL CARE IF: °· You have unexplained abdominal pain. °· You have abdominal pain associated with nausea or diarrhea. °· You have pain when you urinate or have a bowel movement. °· You experience abdominal pain that wakes you in the night. °· You have abdominal pain that is worsened or improved by eating food. °· You have abdominal pain that is worsened with eating fatty foods. °· You have a fever. °SEEK IMMEDIATE MEDICAL CARE IF: °· Your pain does not go away within 2 hours. °· You keep throwing up (vomiting). °· Your pain is felt only in portions of the abdomen, such as the right side or the left lower portion of the abdomen. °· You pass bloody or black tarry stools. °MAKE SURE YOU: °· Understand these instructions. °· Will watch your condition. °· Will get help right away if you are not doing well or get worse. °  °This information is not intended to replace advice given to you by your health care provider. Make sure you discuss  any questions you have with your health care provider. °  °Document Released: 08/13/2005 Document Revised: 07/25/2015 Document Reviewed: 07/13/2013 °Elsevier Interactive Patient Education ©2016 Elsevier Inc[employer] ° °Nausea, Adult °Nausea is the feeling that you have an upset stomach or have to vomit. Nausea by itself is not likely a serious concern, but it may be an early sign of more serious medical problems. As nausea gets worse, it can lead to vomiting. If vomiting develops, there is the risk of dehydration.  °CAUSES  °· Viral infections. °· Food poisoning. °· Medicines. °· Pregnancy. °· Motion sickness. °· Migraine headaches. °· Emotional distress. °· Severe pain from any source. °· Alcohol intoxication. °HOME CARE INSTRUCTIONS °· Get plenty of rest. °· Ask your caregiver about specific rehydration instructions. °· Eat small amounts of food and sip liquids more often. °· Take all medicines as told by your caregiver. °SEEK MEDICAL CARE IF: °· You have not improved after 2 days, or you get worse. °· You have a headache. °SEEK IMMEDIATE MEDICAL CARE IF:  °· You have a fever. °· You faint. °· You keep vomiting or have blood in your vomit. °· You are extremely weak or dehydrated. °· You have dark or bloody stools. °· You have severe chest or abdominal pain. °MAKE SURE YOU: °· Understand these instructions. °· Will watch your condition. °· Will get help right away if you are not doing well or get worse. °  °This information is not intended to replace advice given to   you by your health care provider. Make sure you discuss any questions you have with your health care provider. °  °Document Released: 12/11/2004 Document Revised: 11/24/2014 Document Reviewed: 07/16/2011 °Elsevier Interactive Patient Education ©2016 [employer] Inc. ° °

## 2016-07-08 ENCOUNTER — Emergency Department
Admission: EM | Admit: 2016-07-08 | Discharge: 2016-07-08 | Disposition: A | Discharge status: Home / Self Care | Payer: Medicaid Other | Attending: Student | Admitting: Student

## 2016-07-08 ENCOUNTER — Encounter: Payer: Self-pay | Admitting: Medical Oncology

## 2016-07-08 DIAGNOSIS — X501XXA Overexertion from prolonged static or awkward postures, initial encounter: Secondary | ICD-10-CM | POA: Insufficient documentation

## 2016-07-08 DIAGNOSIS — Z79899 Other long term (current) drug therapy: Secondary | ICD-10-CM | POA: Insufficient documentation

## 2016-07-08 DIAGNOSIS — Y999 Unspecified external cause status: Secondary | ICD-10-CM | POA: Insufficient documentation

## 2016-07-08 DIAGNOSIS — S46911A Strain of unspecified muscle, fascia and tendon at shoulder and upper arm level, right arm, initial encounter: Secondary | ICD-10-CM

## 2016-07-08 DIAGNOSIS — Y929 Unspecified place or not applicable: Secondary | ICD-10-CM | POA: Insufficient documentation

## 2016-07-08 DIAGNOSIS — Y9389 Activity, other specified: Secondary | ICD-10-CM | POA: Insufficient documentation

## 2016-07-08 DIAGNOSIS — Z7951 Long term (current) use of inhaled steroids: Secondary | ICD-10-CM | POA: Insufficient documentation

## 2016-07-08 DIAGNOSIS — Z791 Long term (current) use of non-steroidal anti-inflammatories (NSAID): Secondary | ICD-10-CM | POA: Insufficient documentation

## 2016-07-08 MED ORDER — DICLOFENAC SODIUM 75 MG PO TBEC: 75.0000 mg | DELAYED_RELEASE_TABLET | Freq: Two times a day (BID) | ORAL | 0 refills | Status: AC

## 2016-07-08 MED ORDER — CYCLOBENZAPRINE HCL 5 MG PO TABS: 5.0000 mg | ORAL_TABLET | Freq: Three times a day (TID) | ORAL | 0 refills | Status: DC | PRN

## 2016-07-08 NOTE — ED Triage Notes (Signed)
Pt injured rt shoulder over a month ago, yesterday she was lifting her arm when she felt a pop to shoulder.

## 2016-07-08 NOTE — Discharge Instructions (Signed)
You appear to have a sprain to the shoulder, causing some mild tendinitis (inflammation). Take the prescription meds as directed. Apply ice and follow-up with one of the local community clinics as needed.

## 2016-07-08 NOTE — ED Provider Notes (Signed)
[facility] Emergency Department Provider Note ____________________________________________  Time seen: 1702  I have reviewed the triage vital signs and the nursing notes.  HISTORY  Chief Complaint  Shoulder Problem  HPI Cynthia Norman is a 35 y.o. female presents to the ED for evaluation of nondescript pain to theright shoulder after a presumed injury yesterday. 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 She describes onset of pain about one hour later. She describes the discomfort as achy and stated that "it hurts.". She denies any previous history of ongoing shoulder problems. She does however note a month ago she had some unremarkable injury to the right shoulder has had intermittent discomfort since then. She has not sought care for his she treated her pain in the interim. She denies any distal paresthesias, catch, click, or give way with shoulder range of motion.  History reviewed. No pertinent past medical history.  There are no active problems to display for this patient.  Past Surgical History:  Procedure Laterality Date  . CESAREAN SECTION    . TONSILLECTOMY    . TUBAL LIGATION      Prior to Admission medications   Medication Sig Start Date End Date Taking? Authorizing Provider  brompheniramine-pseudoephedrine-DM 30-2-10 MG/5ML syrup Take 10 mLs by mouth 4 (four) times daily as needed. 01/16/16   Jami L Hagler, PA-C  chlorpheniramine-HYDROcodone (TUSSIONEX PENNKINETIC ER) 10-8 MG/5ML SUER Take 5 mLs by mouth 2 (two) times daily. 03/24/16   Joni Reiningonald K Smith, PA-C  cyclobenzaprine (FLEXERIL) 5 MG tablet Take 1 tablet (5 mg total) by mouth 3 (three) times daily as needed for muscle spasms. 07/08/16   Ladawn Boullion V Bacon Mysty Kielty, PA-C  diclofenac (VOLTAREN) 75 MG EC tablet Take 1 tablet (75 mg total) by mouth 2 (two) times daily.  07/08/16 07/23/16  Dietra Stokely V Bacon Jackelynn Hosie, PA-C  fluticasone (FLONASE) 50 MCG/ACT nasal spray Place 2 sprays into both nostrils daily. 01/16/16   Jami L Hagler, PA-C  HYDROcodone-acetaminophen (NORCO) 5-325 MG tablet Take 1-2 tablets by mouth every 4 (four) hours as needed for moderate pain. 12/12/15   Charmayne Sheerharles M Beers, PA-C  ibuprofen (ADVIL,MOTRIN) 800 MG tablet Take 1 tablet (800 mg total) by mouth every 8 (eight) hours as needed. 12/12/15   Evangeline Dakinharles M Beers, PA-C  methylPREDNISolone (MEDROL DOSEPAK) 4 MG TBPK tablet Take Tapered dose as directed 03/24/16   Joni Reiningonald K Smith, PA-C  oseltamivir (TAMIFLU) 75 MG capsule Take 1 capsule (75 mg total) by mouth 2 (two) times daily. 01/16/16   Jami L Hagler, PA-C    Allergies Levaquin [levofloxacin in d5w]  No family history on file.  Social History Social History  Substance Use Topics  . Smoking status: Never Smoker  . Smokeless tobacco: Never Used  . Alcohol use No   Review of Systems  Constitutional: Negative for fever. Cardiovascular: Negative for chest pain. Respiratory: Negative for shortness of breath. Musculoskeletal: Negative for back pain. Right shoulder pain as above. Neurological: Negative for headaches, focal weakness or numbness. ____________________________________________  PHYSICAL EXAM:  VITAL SIGNS: ED Triage Vitals  Enc Vitals Group     BP 07/08/16 1555 120/86     Pulse Rate 07/08/16 1555 87     Resp 07/08/16 1555 18     Temp 07/08/16 1555 98 F (36.7 C)     Temp Source 07/08/16 1555 Oral  SpO2 07/08/16 1555 97 %     Weight 07/08/16 1554 175 lb (79.4 kg)     Height 07/08/16 1554 5\' 5"  (1.651 m)     Head Circumference --      Peak Flow --      Pain Score 07/08/16 1554 7     Pain Loc --      Pain Edu? --      Excl. in GC? --     Constitutional: Alert and oriented. Well appearing and in no distress. Head: Normocephalic and atraumatic. Cardiovascular: Normal rate, regular rhythm. Distal pulses. Respiratory: Normal  respiratory effort. No wheezes/rales/rhonchi. Musculoskeletal: Right shoulder without any obvious deformity, dislocation, or sulcus sign. Patient with normal tenderness to palpation over the biceps tendon groove. Normal passive range of motion in the shoulder in all planes. Normal rotator cuff resistance testing on exam. Normal composite fist bilaterally. Nontender with normal range of motion in all extremities.  Neurologic: Cranial nerves II through XII grossly intact. Normal UE DTRs bilaterally. Normal gait without ataxia. Normal speech and language. No gross focal neurologic deficits are appreciated. Skin:  Skin is warm, dry and intact. No rash noted. ____________________________________________   RADIOLOGY  Not indicated ____________________________________________  INITIAL IMPRESSION / ASSESSMENT AND PLAN / ED COURSE  Patient with a right shoulder strain without indication of internal derangement. She is discharged with prescriptions for Flexeril and Voltaren to dose as directed. She is advised to apply ice to the shoulder for comfort. She should follow up with the primary care provider or one of the local clinic clinics for ongoing symptom management.  Clinical Course   ____________________________________________  FINAL CLINICAL IMPRESSION(S) / ED DIAGNOSES  Final diagnoses:  Shoulder strain, right, initial encounter      Lissa HoardJenise V Bacon Riese Hellard, PA-C 07/11/16 1906    Gayla DossEryka A Gayle, MD 07/12/16 [phone]

## 2016-07-11 ENCOUNTER — Encounter: Payer: Self-pay | Admitting: Physician Assistant

## 2017-08-20 ENCOUNTER — Encounter: Payer: Self-pay | Admitting: *Deleted

## 2017-08-20 ENCOUNTER — Emergency Department
Admission: EM | Admit: 2017-08-20 | Discharge: 2017-08-20 | Disposition: A | Discharge status: Home / Self Care | Payer: 59 | Attending: Emergency Medicine | Admitting: Emergency Medicine

## 2017-08-20 ENCOUNTER — Emergency Department: Payer: 59

## 2017-08-20 DIAGNOSIS — W010XXA Fall on same level from slipping, tripping and stumbling without subsequent striking against object, initial encounter: Secondary | ICD-10-CM | POA: Diagnosis not present

## 2017-08-20 DIAGNOSIS — Z79899 Other long term (current) drug therapy: Secondary | ICD-10-CM | POA: Diagnosis not present

## 2017-08-20 DIAGNOSIS — Y92009 Unspecified place in unspecified non-institutional (private) residence as the place of occurrence of the external cause: Secondary | ICD-10-CM | POA: Insufficient documentation

## 2017-08-20 DIAGNOSIS — Y9389 Activity, other specified: Secondary | ICD-10-CM | POA: Diagnosis not present

## 2017-08-20 DIAGNOSIS — S4991XA Unspecified injury of right shoulder and upper arm, initial encounter: Secondary | ICD-10-CM | POA: Diagnosis not present

## 2017-08-20 DIAGNOSIS — Y998 Other external cause status: Secondary | ICD-10-CM | POA: Diagnosis not present

## 2017-08-20 MED ORDER — KETOROLAC TROMETHAMINE 30 MG/ML IJ SOLN
30.0000 mg | Freq: Once | INTRAMUSCULAR | Status: AC
  Administered 2017-08-20: 30 mg via INTRAMUSCULAR
  Filled 2017-08-20: qty 1

## 2017-08-20 MED ORDER — IBUPROFEN 800 MG PO TABS: 800.0000 mg | ORAL_TABLET | Freq: Three times a day (TID) | ORAL | 0 refills | Status: AC | PRN

## 2017-08-20 NOTE — ED Notes (Signed)
See triage note  Presents with right shoulder pain s/p fall  States she tripped over her cats   No deformity noted good pulses and sensation

## 2017-08-20 NOTE — ED Notes (Signed)
Pt is ambulatory with family at discharge. NAD, VSS. No concerns or needs voiced.

## 2017-08-20 NOTE — ED Triage Notes (Signed)
Pt states her cats tripped her in the middle of the night, states right shoulder pain, able to move arm

## 2017-08-20 NOTE — ED Provider Notes (Signed)
[facility] Emergency Department Provider Note  ____________________________________________  Time seen: Approximately 11:41 AM  I have reviewed the triage vital signs and the nursing notes.   HISTORY  Chief Complaint Fall and Shoulder Injury    HPI Cynthia Norman is a 36 y.o. female that presents to the emergency department for evaluation of right shoulder pain after tripping over her cat last night. Patient states that it was the middle of the night and dark and she did not see the cat. She landed on her left hand, right shoulder, right hip. The only pain she has right now is her right shoulder. She is able to move it but with pain. She is right-handed. She did not hit her head or lose consciousness. She took Tylenol for pain, which did not help. She denies shortness breath, chest pain, nausea, vomiting, abdominal pain, numbness, tingling.   History reviewed. No pertinent past medical history.  There are no active problems to display for this patient.   Past Surgical History:  Procedure Laterality Date  . CESAREAN SECTION    . TONSILLECTOMY    . TUBAL LIGATION      Prior to Admission medications   Medication Sig Start Date End Date Taking? Authorizing Provider  brompheniramine-pseudoephedrine-DM 30-2-10 MG/5ML syrup Take 10 mLs by mouth 4 (four) times daily as needed. 01/16/16   Hagler, Jami L, PA-C  chlorpheniramine-HYDROcodone (TUSSIONEX PENNKINETIC ER) 10-8 MG/5ML SUER Take 5 mLs by mouth 2 (two) times daily. 03/24/16   Joni Reining, PA-C  cyclobenzaprine (FLEXERIL) 5 MG tablet Take 1 tablet (5 mg total) by mouth 3 (three) times daily as needed for muscle spasms. 07/08/16   Menshew, Charlesetta Ivory, PA-C  fluticasone (FLONASE) 50 MCG/ACT nasal spray Place 2 sprays into both nostrils daily. 01/16/16   Hagler, Jami L, PA-C  HYDROcodone-acetaminophen (NORCO) 5-325 MG tablet Take 1-2 tablets by mouth every 4 (four) hours as needed for moderate pain. 12/12/15    Beers, Charmayne Sheer, PA-C  ibuprofen (ADVIL,MOTRIN) 800 MG tablet Take 1 tablet (800 mg total) by mouth every 8 (eight) hours as needed. 08/20/17   Enid Derry, PA-C  methylPREDNISolone (MEDROL DOSEPAK) 4 MG TBPK tablet Take Tapered dose as directed 03/24/16   Joni Reining, PA-C  oseltamivir (TAMIFLU) 75 MG capsule Take 1 capsule (75 mg total) by mouth 2 (two) times daily. 01/16/16   Hagler, Jami L, PA-C    Allergies Levaquin [levofloxacin in d5w]  History reviewed. No pertinent family history.  Social History Social History  Substance Use Topics  . Smoking status: Never Smoker  . Smokeless tobacco: Never Used  . Alcohol use No     Review of Systems  Constitutional: No fever/chills Cardiovascular: No chest pain. Respiratory: No SOB. Gastrointestinal: No abdominal pain.  No nausea, no vomiting.  Musculoskeletal: Positive for shoulder pain. Skin: Negative for rash, abrasions, lacerations, ecchymosis. Neurological: Negative for headaches, numbness or tingling   ____________________________________________   PHYSICAL EXAM:  VITAL SIGNS: ED Triage Vitals  Enc Vitals Group     BP 08/20/17 1049 120/71     Pulse Rate 08/20/17 1049 78     Resp 08/20/17 1049 18     Temp 08/20/17 1049 98.1 F (36.7 C)     Temp Source 08/20/17 1049 Oral     SpO2 08/20/17 1049 100 %     Weight 08/20/17 1048 170 lb (77.1 kg)     Height 08/20/17 1048  (1.651 m)     Head Circumference --  Peak Flow --      Pain Score 08/20/17 1046 8     Pain Loc --      Pain Edu? --      Excl. in GC? --      Constitutional: Alert and oriented. Well appearing and in no acute distress. Eyes: Conjunctivae are normal. PERRL. EOMI. Head: Atraumatic. ENT:      Ears:      Nose: No congestion/rhinnorhea.      Mouth/Throat: Mucous membranes are moist.  Neck: No stridor.  Cardiovascular: Normal rate, regular rhythm.  Good peripheral circulation. 2+ radial pulses. Respiratory: Normal respiratory effort  without tachypnea or retractions. Lungs CTAB. Good air entry to the bases with no decreased or absent breath sounds. Musculoskeletal: Full range of motion to all extremities. No gross deformities appreciated. Diffuse tenderness to palpation over right shoulder. Full range of motion but with pain. Neurologic:  Normal speech and language. No gross focal neurologic deficits are appreciated.  Skin:  Skin is warm, dry and intact. No rash noted.   ____________________________________________   LABS (all labs ordered are listed, but only abnormal results are displayed)  Labs Reviewed - No data to display ____________________________________________  EKG   ____________________________________________  RADIOLOGY Lexine Baton, personally viewed and evaluated these images (plain radiographs) as part of my medical decision making, as well as reviewing the written report by the radiologist.  Dg Shoulder Right  Result Date: 08/20/2017 CLINICAL DATA:  36 year old female with right shoulder pain after fall, tripped over CT. Decreased range of motion. EXAM: RIGHT SHOULDER - 2+ VIEW COMPARISON:  None. FINDINGS: There is no evidence of fracture or dislocation. There is no evidence of arthropathy or other focal bone abnormality. Soft tissues are unremarkable. IMPRESSION: Negative. Electronically Signed   By: Odessa Fleming M.D.   On: 08/20/2017 11:19    ____________________________________________    PROCEDURES  Procedure(s) performed:    Procedures    Medications  ketorolac (TORADOL) 30 MG/ML injection 30 mg (30 mg Intramuscular Given 08/20/17 1153)     ____________________________________________   INITIAL IMPRESSION / ASSESSMENT AND PLAN / ED COURSE  Pertinent labs & imaging results that were available during my care of the patient were reviewed by me and considered in my medical decision making (see chart for details).  Review of the [place] CSRS was performed in accordance of the [facility]  prior to dispensing any controlled drugs.   She presented to the emergency department for evaluation of shoulder pain. Vital signs and exam are reassuring. X-ray negative for acute bony abnormalities. IM Toradol and shoulder sling were given. Patient will be discharged home with prescriptions for ibuprofen. Patient is to follow up with PCP as directed. Patient is given ED precautions to return to the ED for any worsening or new symptoms.     ____________________________________________  FINAL CLINICAL IMPRESSION(S) / ED DIAGNOSES  Final diagnoses:  Injury of right shoulder, initial encounter      NEW MEDICATIONS STARTED DURING THIS VISIT:  Discharge Medication List as of 08/20/2017 12:15 PM          This chart was dictated using voice recognition software/Dragon. Despite best efforts to proofread, errors can occur which can change the meaning. Any change was purely unintentional.    Enid Derry, PA-C 08/20/17 1244    Governor Rooks, MD 08/20/17 [phone]

## 2021-04-07 ENCOUNTER — Emergency Department: Payer: Self-pay

## 2021-04-07 ENCOUNTER — Other Ambulatory Visit: Payer: Self-pay

## 2021-04-07 ENCOUNTER — Encounter: Payer: Self-pay | Admitting: Emergency Medicine

## 2021-04-07 ENCOUNTER — Emergency Department
Admission: EM | Admit: 2021-04-07 | Discharge: 2021-04-08 | Disposition: A | Discharge status: Home / Self Care | Payer: Self-pay | Attending: Emergency Medicine | Admitting: Emergency Medicine

## 2021-04-07 DIAGNOSIS — R079 Chest pain, unspecified: Secondary | ICD-10-CM | POA: Insufficient documentation

## 2021-04-07 DIAGNOSIS — G43819 Other migraine, intractable, without status migrainosus: Secondary | ICD-10-CM

## 2021-04-07 DIAGNOSIS — Z20822 Contact with and (suspected) exposure to covid-19: Secondary | ICD-10-CM | POA: Insufficient documentation

## 2021-04-07 DIAGNOSIS — G43809 Other migraine, not intractable, without status migrainosus: Secondary | ICD-10-CM | POA: Insufficient documentation

## 2021-04-07 DIAGNOSIS — R0602 Shortness of breath: Secondary | ICD-10-CM | POA: Insufficient documentation

## 2021-04-07 LAB — URINALYSIS, COMPLETE (UACMP) WITH MICROSCOPIC
Bacteria, UA: NONE SEEN
Bilirubin Urine: NEGATIVE
Glucose, UA: NEGATIVE mg/dL
Hgb urine dipstick: NEGATIVE
Ketones, ur: 5 mg/dL — AB
Nitrite: NEGATIVE
Protein, ur: NEGATIVE mg/dL
Specific Gravity, Urine: 1.02 (ref 1.005–1.030)
pH: 5 (ref 5.0–8.0)

## 2021-04-07 LAB — HEPATIC FUNCTION PANEL
ALT: 12 U/L (ref 0–44)
AST: 16 U/L (ref 15–41)
Albumin: 4.3 g/dL (ref 3.5–5.0)
Alkaline Phosphatase: 53 U/L (ref 38–126)
Bilirubin, Direct: <0.1 mg/dL (ref 0.0–0.2)
Total Bilirubin: 0.6 mg/dL (ref 0.3–1.2)
Total Protein: 7.9 g/dL (ref 6.5–8.1)

## 2021-04-07 LAB — BASIC METABOLIC PANEL
Anion gap: 9 (ref 5–15)
BUN: 9 mg/dL (ref 6–20)
CO2: 24 mmol/L (ref 22–32)
Calcium: 9.1 mg/dL (ref 8.9–10.3)
Chloride: 106 mmol/L (ref 98–111)
Creatinine, Ser: 0.77 mg/dL (ref 0.44–1.00)
GFR, Estimated: >60 mL/min (ref >60)
Glucose, Bld: 109 mg/dL — ABNORMAL HIGH (ref 70–99)
Potassium: 3.8 mmol/L (ref 3.5–5.1)
Sodium: 139 mmol/L (ref 135–145)

## 2021-04-07 LAB — RESP PANEL BY RT-PCR (FLU A&B, COVID) ARPGX2
Influenza A by PCR: NEGATIVE
Influenza B by PCR: NEGATIVE
SARS Coronavirus 2 by RT PCR: NEGATIVE

## 2021-04-07 LAB — CBC
HCT: 35.3 % — ABNORMAL LOW (ref 36.0–46.0)
Hemoglobin: 11.2 g/dL — ABNORMAL LOW (ref 12.0–15.0)
MCH: 28.1 pg (ref 26.0–34.0)
MCHC: 31.7 g/dL (ref 30.0–36.0)
MCV: 88.7 fL (ref 80.0–100.0)
Platelets: 258 10*3/uL (ref 150–400)
RBC: 3.98 MIL/uL (ref 3.87–5.11)
RDW: 14.6 % (ref 11.5–15.5)
WBC: 9.5 10*3/uL (ref 4.0–10.5)
nRBC: 0 % (ref 0.0–0.2)

## 2021-04-07 LAB — TROPONIN I (HIGH SENSITIVITY): Troponin I (High Sensitivity): <2 ng/L (ref <18)

## 2021-04-07 LAB — PREGNANCY, URINE: Preg Test, Ur: NEGATIVE

## 2021-04-07 LAB — LIPASE, BLOOD: Lipase: 45 U/L (ref 11–51)

## 2021-04-07 MED ORDER — KETOROLAC TROMETHAMINE 30 MG/ML IJ SOLN
30.0000 mg | Freq: Once | INTRAMUSCULAR | Status: AC
  Administered 2021-04-07: 30 mg via INTRAMUSCULAR
  Filled 2021-04-07: qty 1

## 2021-04-07 MED ORDER — BUTALBITAL-APAP-CAFFEINE 50-325-40 MG PO TABS: 1.0000 | ORAL_TABLET | Freq: Four times a day (QID) | ORAL | 0 refills | Status: AC | PRN

## 2021-04-07 MED ORDER — ACETAMINOPHEN 500 MG PO TABS
1000.0000 mg | ORAL_TABLET | Freq: Once | ORAL | Status: AC
  Administered 2021-04-07: 1000 mg via ORAL
  Filled 2021-04-07: qty 2

## 2021-04-07 NOTE — ED Provider Notes (Addendum)
[facility] Emergency Department Provider Note  ____________________________________________   Event Date/Time   First MD Initiated Contact with Patient 04/07/21 2106     (approximate)  I have reviewed the triage vital signs and the nursing notes.   HISTORY  Chief Complaint Headache, Dizziness, Chest Pain, and Back Pain    HPI Cynthia Norman is a 40 y.o. female who is otherwise healthy and on medications who comes in for multiple concerns.  First patient notes a headache.  Patient reports a history of migraines for years has never had prior imaging.  She reports that her headaches have become more frequently and more constant over the past 3 weeks, over her entire head, nothing makes it better including Tylenol, nothing makes it worse.  She does report some weight loss associated with it and some nausea but no vomiting.  Does not wake her up from sleep.  Not sudden or severe in onset.  Patient also reports some chest pain and shortness of breath.  She states that her father died of a heart attack and she wanted to make sure she was not having a heart attack.  She states that the shortness of breath is mild, worse with exertion, denies any risk factors for PE.  Not pleuritic chest pain.  She reports a little bit of pressure on either side of her chest that started today.  She also reports some intermittent dizziness when she stands up.  She denies any falls or hitting her head.           History reviewed. No pertinent past medical history.  There are no problems to display for this patient.   Past Surgical History:  Procedure Laterality Date  . CESAREAN SECTION    . TONSILLECTOMY    . TUBAL LIGATION      Prior to Admission medications   Medication Sig Start Date End Date Taking? Authorizing Provider  brompheniramine-pseudoephedrine-DM 30-2-10 MG/5ML syrup Take 10 mLs by mouth 4 (four) times daily as needed. 01/16/16   Hagler, Jami L, PA-C   chlorpheniramine-HYDROcodone (TUSSIONEX PENNKINETIC ER) 10-8 MG/5ML SUER Take 5 mLs by mouth 2 (two) times daily. 03/24/16   Joni Reining, PA-C  cyclobenzaprine (FLEXERIL) 5 MG tablet Take 1 tablet (5 mg total) by mouth 3 (three) times daily as needed for muscle spasms. 07/08/16   Menshew, Charlesetta Ivory, PA-C  fluticasone (FLONASE) 50 MCG/ACT nasal spray Place 2 sprays into both nostrils daily. 01/16/16   Hagler, Jami L, PA-C  HYDROcodone-acetaminophen (NORCO) 5-325 MG tablet Take 1-2 tablets by mouth every 4 (four) hours as needed for moderate pain. 12/12/15   Beers, Charmayne Sheer, PA-C  ibuprofen (ADVIL,MOTRIN) 800 MG tablet Take 1 tablet (800 mg total) by mouth every 8 (eight) hours as needed. 08/20/17   Enid Derry, PA-C  methylPREDNISolone (MEDROL DOSEPAK) 4 MG TBPK tablet Take Tapered dose as directed 03/24/16   Joni Reining, PA-C  oseltamivir (TAMIFLU) 75 MG capsule Take 1 capsule (75 mg total) by mouth 2 (two) times daily. 01/16/16   Hagler, Jami L, PA-C  ranitidine (ZANTAC) 150 MG tablet Take 1 tablet (150 mg total) by mouth at bedtime. 05/30/15 12/12/15  Emily Filbert, MD  sucralfate (CARAFATE) 1 G tablet Take 1 tablet (1 g total) by mouth 4 (four) times daily. 05/30/15 12/12/15  Emily Filbert, MD    Allergies Levaquin [levofloxacin in d5w]  No family history on file.  Social History Social History   Tobacco Use  .  Smoking status: Never Smoker  . Smokeless tobacco: Never Used  Substance Use Topics  . Alcohol use: No  . Drug use: No      Review of Systems Constitutional: No fever/chills Eyes: No visual changes. ENT: No sore throat. Cardiovascular: Positive chest pain Respiratory: Positive shortness of breath Gastrointestinal: No abdominal pain.  No nausea, no vomiting.  No diarrhea.  No constipation. Genitourinary: Negative for dysuria. Musculoskeletal: Negative for back pain. Skin: Negative for rash. Neurological: Positive headache focal weakness or  numbness. All other ROS negative ____________________________________________   PHYSICAL EXAM:  VITAL SIGNS: ED Triage Vitals [04/07/21 2019]  Enc Vitals Group     BP (!) 147/82     Pulse Rate 82     Resp 18     Temp 98.5 F (36.9 C)     Temp src      SpO2 98 %     Weight 167 lb (75.8 kg)     Height 5\' 5"  (1.651 m)     Head Circumference      Peak Flow      Pain Score 8     Pain Loc      Pain Edu?      Excl. in GC?     Constitutional: Alert and oriented. Well appearing and in no acute distress. Eyes: Conjunctivae are normal. EOMI. Head: Atraumatic. Nose: No congestion/rhinnorhea. Mouth/Throat: Mucous membranes are moist.   Neck: No stridor. Trachea Midline. FROM Cardiovascular: Normal rate, regular rhythm. Grossly normal heart sounds.  Good peripheral circulation. Respiratory: Normal respiratory effort.  No retractions. Lungs CTAB. Gastrointestinal: Soft and nontender. No distention. No abdominal bruits.  Musculoskeletal: No lower extremity tenderness nor edema.  No joint effusions. Neurologic:  Normal speech and language. No gross focal neurologic deficits are appreciated.  Skin:  Skin is warm, dry and intact. No rash noted. Psychiatric: Mood and affect are normal. Speech and behavior are normal. GU: Deferred   ____________________________________________   LABS (all labs ordered are listed, but only abnormal results are displayed)  Labs Reviewed  BASIC METABOLIC PANEL - Abnormal; Notable for the following components:      Result Value   Glucose, Bld 109 (*)    All other components within normal limits  CBC - Abnormal; Notable for the following components:   Hemoglobin 11.2 (*)    HCT 35.3 (*)    All other components within normal limits  URINALYSIS, COMPLETE (UACMP) WITH MICROSCOPIC - Abnormal; Notable for the following components:   Color, Urine YELLOW (*)    APPearance HAZY (*)    Ketones, ur 5 (*)    Leukocytes,Ua TRACE (*)    All other components  within normal limits  CBG MONITORING, ED   ____________________________________________   ED ECG REPORT I, Concha SeMary E Clary Boulais, the attending physician, personally viewed and interpreted this ECG.  Normal sinus rate of 84, no ST elevation, no T wave inversions, normal intervals ____________________________________________  RADIOLOGY Vela ProseI, Chaniah Cisse E Darian Ace, personally viewed and evaluated these images (plain radiographs) as part of my medical decision making, as well as reviewing the written report by the radiologist.  ED MD interpretation: No pneumonia  Official radiology report(s): DG Chest 2 View  Result Date: 04/07/2021 CLINICAL DATA:  Chest pain EXAM: CHEST - 2 VIEW COMPARISON:  03/24/2016 FINDINGS: The heart size and mediastinal contours are within normal limits. Both lungs are clear. The visualized skeletal structures are unremarkable. IMPRESSION: No active cardiopulmonary disease. Electronically Signed   By: Helyn NumbersAshesh  Parikh MD  On: 04/07/2021 22:16   CT Head Wo Contrast  Result Date: 04/07/2021 CLINICAL DATA:  Dizziness, nonspecific headache EXAM: CT HEAD WITHOUT CONTRAST TECHNIQUE: Contiguous axial images were obtained from the base of the skull through the vertex without intravenous contrast. COMPARISON:  None. FINDINGS: Brain: Normal anatomic configuration. No abnormal intra or extra-axial mass lesion or fluid collection. No abnormal mass effect or midline shift. No evidence of acute intracranial hemorrhage or infarct. Ventricular size is normal. Cerebellum unremarkable. Vascular: Unremarkable Skull: Intact Sinuses/Orbits: Moderate mucosal thickening within the visualized paranasal sinuses with several opacified ethmoid air cells noted bilaterally. Orbits are unremarkable. Other: Mastoid air cells and middle ear cavities are clear. IMPRESSION: No acute intracranial abnormality. Moderate paranasal sinus disease. Electronically Signed   By: Helyn Numbers MD   On: 04/07/2021 22:19     ____________________________________________   PROCEDURES  Procedure(s) performed (including Critical Care):  Procedures   ____________________________________________   INITIAL IMPRESSION / ASSESSMENT AND PLAN / ED COURSE  JAZZMINE KLEIMAN was evaluated in Emergency Department on 04/07/2021 for the symptoms described in the history of present illness. She was evaluated in the context of the global COVID-19 pandemic, which necessitated consideration that the patient might be at risk for infection with the SARS-CoV-2 virus that causes COVID-19. Institutional protocols and algorithms that pertain to the evaluation of patients at risk for COVID-19 are in a state of rapid change based on information released by regulatory bodies including the CDC and federal and state organizations. These policies and algorithms were followed during the patient's care in the ED.    Patient comes in with most likely migraines but given the weight loss will get CT head to make sure no evidence of mass.  Holding off on contrast due to contrast shortage.  No blurry vision to suggest pseudotumor cerebri.  Not sudden or severe in onset to suggest subarachnoid.  Patient declining pain medication at this time.  Patient mostly worried that she was having a heart attack which is why she presented.  Will get cardiac markers and chest x-ray to evaluate for ACS, pneumonia, pneumothorax.  Patient is PERC negative unlikely PE.  Patient has normal vital signs and very reassuring exam.  Labs are reassuring.  Cardiac marker less than 2 and chest has been going on for greater than 3 hours.  No chest pain at this time.  CT head was negative except for concerns for possible sinusitis but patient denies any sinusitis symptoms and does not think her headaches are related to this.    Offered patient some Tylenol and Toradol to help with pain to that patient is still dry patient expressed understanding and felt comfortable with this.   Prescribe some Fioricet for discharge home.  Discharge not to drive on it.  Patient will follow-up with PCP and return to the ER if her symptoms are worsening but feels comfortable at this time going given reassuring work-up.  We will give her cardiology referral. Denies any Chest pain now.         ____________________________________________   FINAL CLINICAL IMPRESSION(S) / ED DIAGNOSES   Final diagnoses:  Other migraine without status migrainosus, intractable  Chest pain, unspecified type      MEDICATIONS GIVEN DURING THIS VISIT:  Medications  acetaminophen (TYLENOL) tablet 1,000 mg (1,000 mg Oral Given 04/07/21 2244)  ketorolac (TORADOL) 30 MG/ML injection 30 mg (30 mg Intramuscular Given 04/07/21 2245)     ED Discharge Orders         Ordered  butalbital-acetaminophen-caffeine (FIORICET) 50-325-40 MG tablet  Every 6 hours PRN        04/07/21 2307           Note:  This document was prepared using Dragon voice recognition software and may include unintentional dictation errors.   Concha Se, MD 04/07/21 2308    Concha Se, MD 04/07/21 2312

## 2021-04-07 NOTE — Discharge Instructions (Addendum)
No signs of a heart attack.  We do use a medication to help with your headache.  Do not drive while on this or operate machinery.  Follow-up with your primary care doctor to discuss further treatments for your migraine.  Return to ER if develop worsening shortness of breath or chest pain or any other concerns

## 2021-04-07 NOTE — ED Notes (Signed)
ED Provider at bedside. 

## 2021-04-07 NOTE — ED Triage Notes (Signed)
Pt states that she has had a headache for three weeks. SHOB 3 to for days and dizziness for a week or two. She has had back pain that is causing her to have chest pain.

## 2022-02-15 IMAGING — CT CT HEAD W/O CM
3 series · 16 of 47 positions shown, 19 images · non-contrast
Comparison: None.

CLINICAL DATA: Dizziness, nonspecific headache

EXAM:
CT HEAD WITHOUT CONTRAST
TECHNIQUE: Contiguous axial images were obtained from the base of the skull
through the vertex without intravenous contrast.

[Series 2: head wo · axial · 0.42mm/px · z∈[-143,-18]mm · 10 of 30 slices shown, 13 images]
[im 3/30  brain]
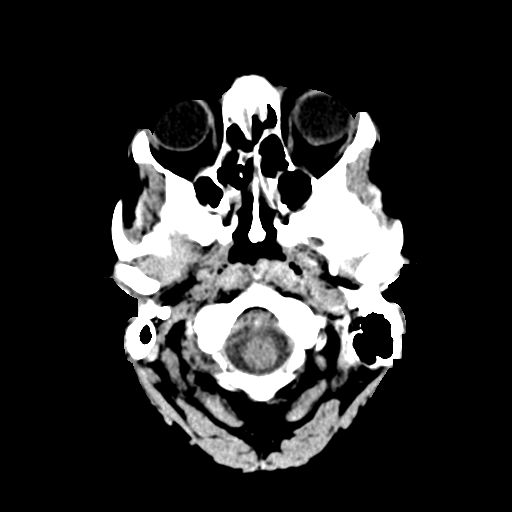
[im 3/30  bone]
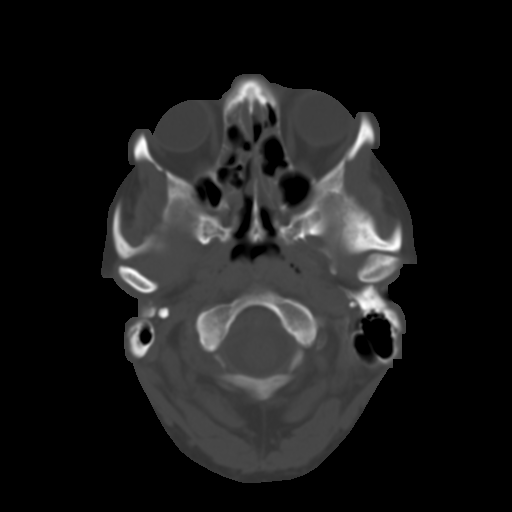
[im 6/30  brain]
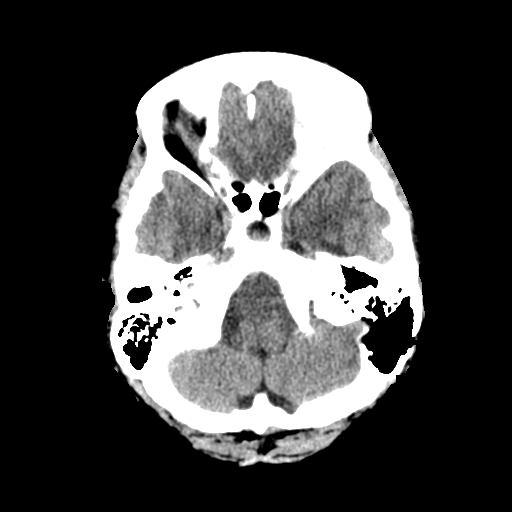
[im 9/30  brain]
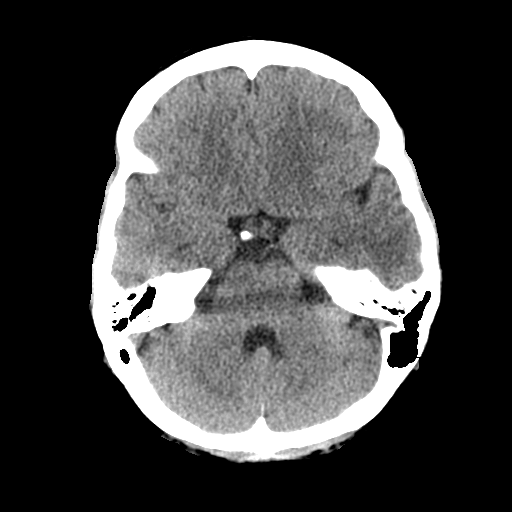
[im 11/30  brain]
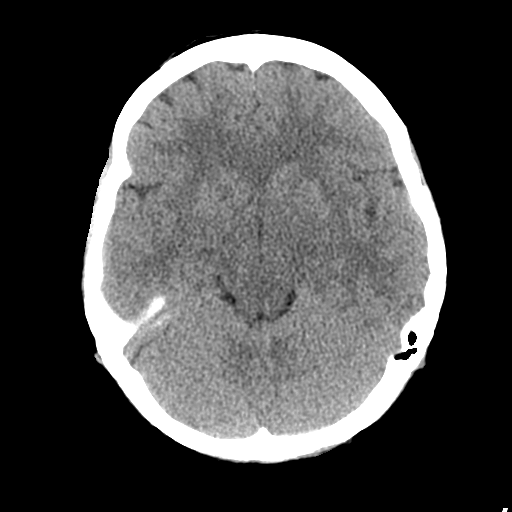
[im 14/30  brain]
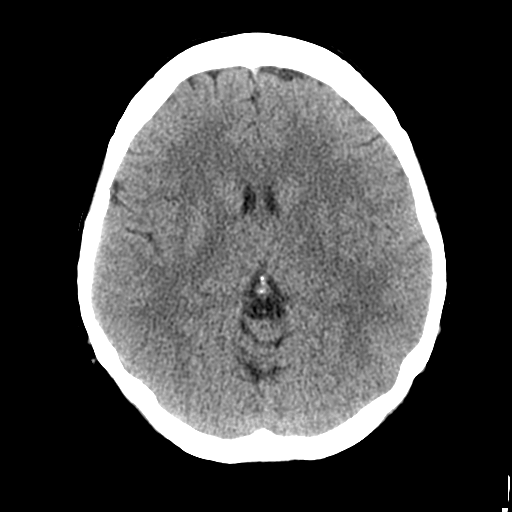
[im 14/30  bone]
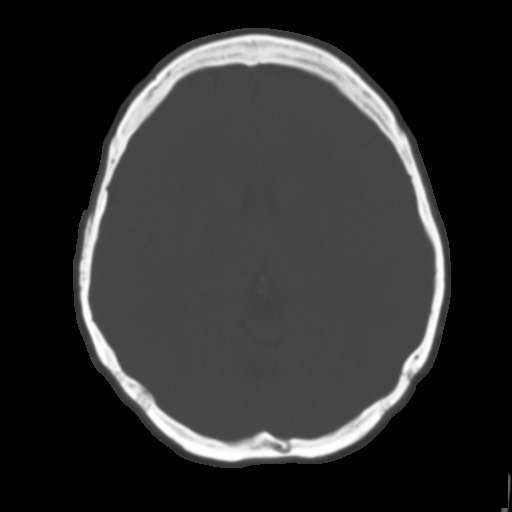
[im 17/30  brain]
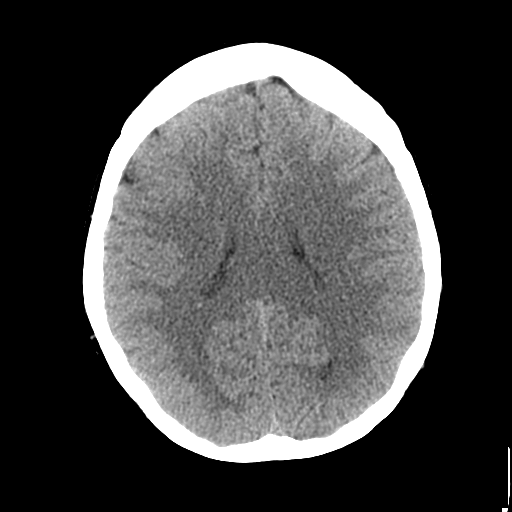
[im 20/30  brain]
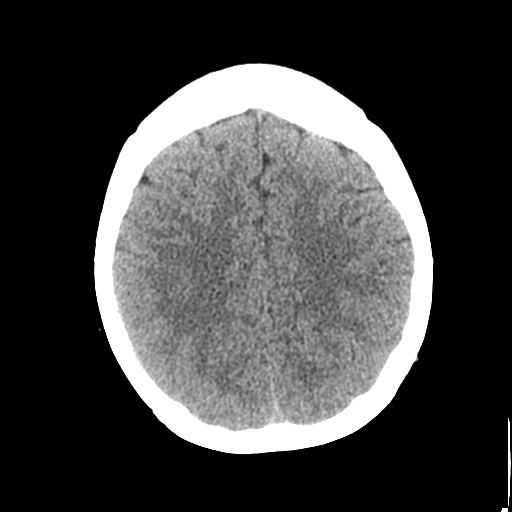
[im 23/30  brain]
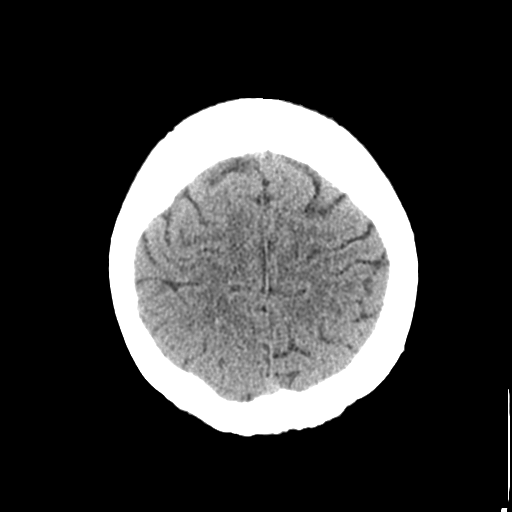
[im 25/30  brain]
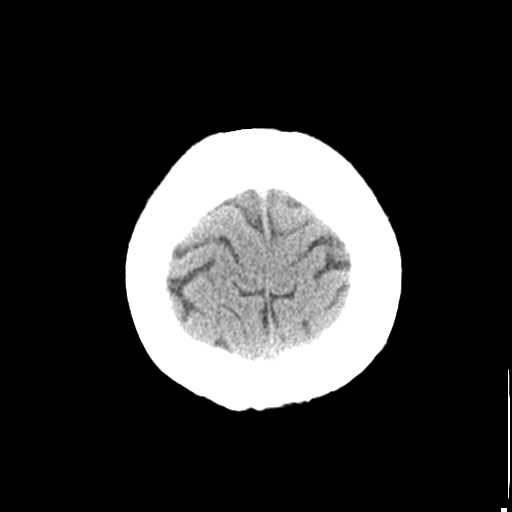
[im 25/30  bone]
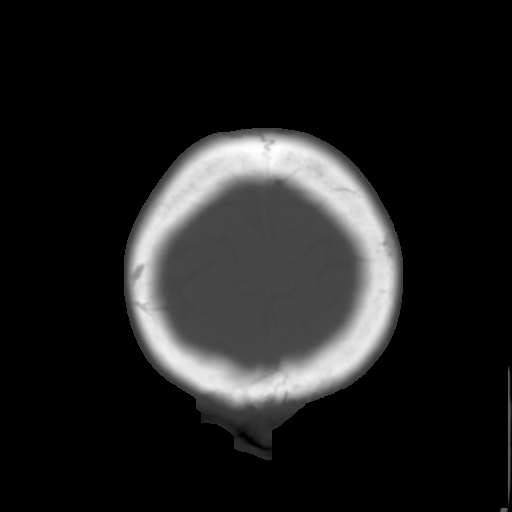
[im 28/30  brain]
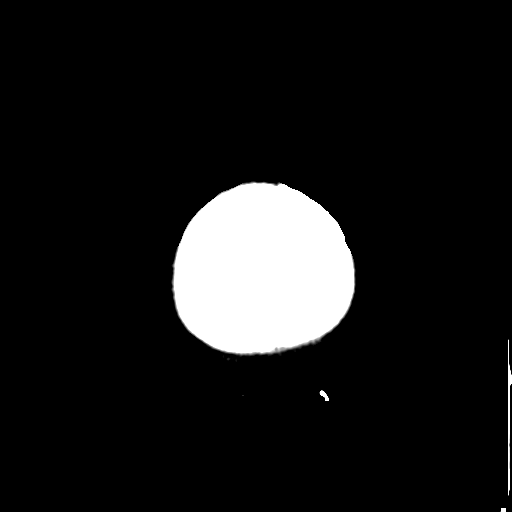

[Series 4: coronal soft tissue · coronal · 0.33mm/px · 3 of 68 slices shown]
[im 23/68  brain]
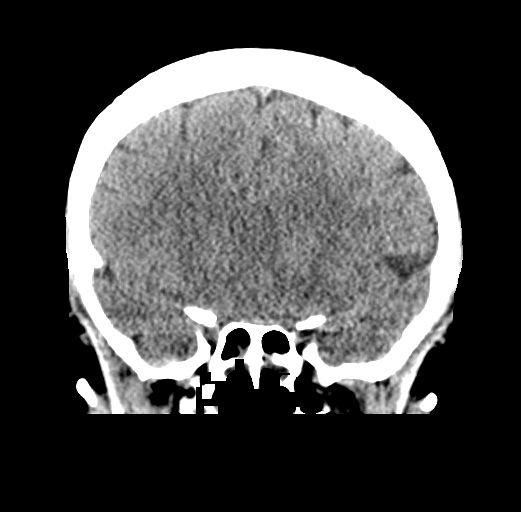
[im 30/68  brain]
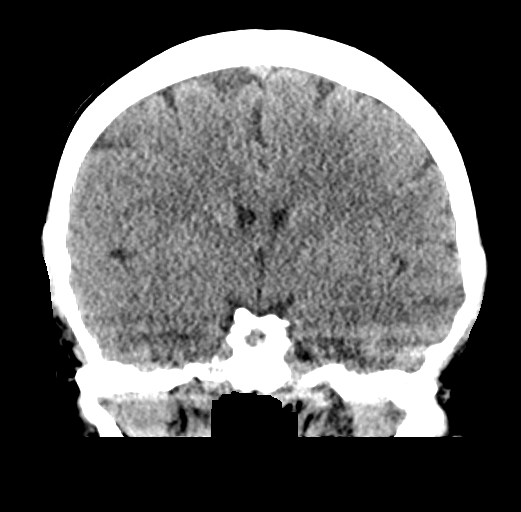
[im 38/68  brain]
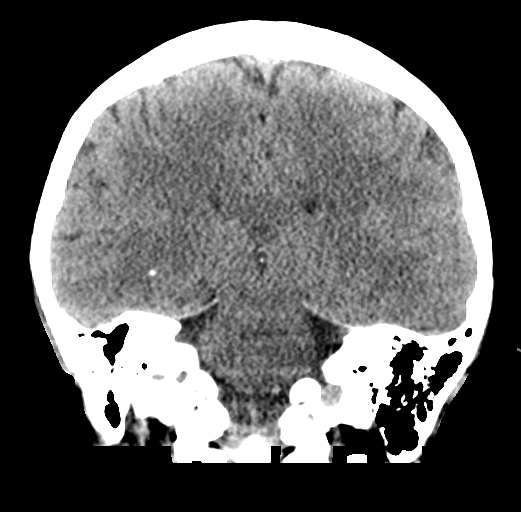

[Series 5: sagittal soft tissue · sagittal · 0.34mm/px · 3 of 58 slices shown]
[im 20/58  brain]
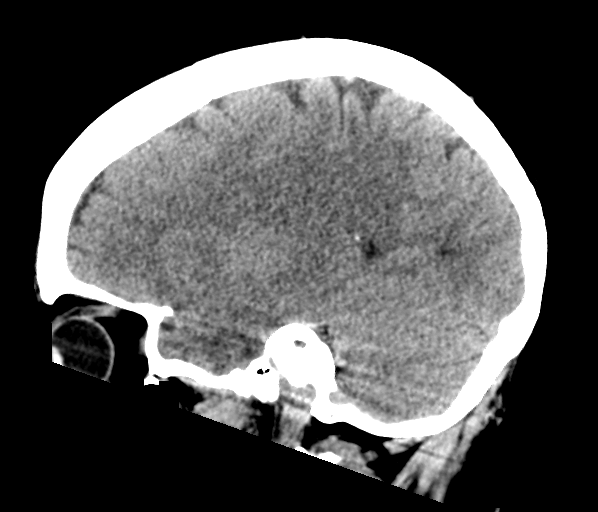
[im 29/58  brain]
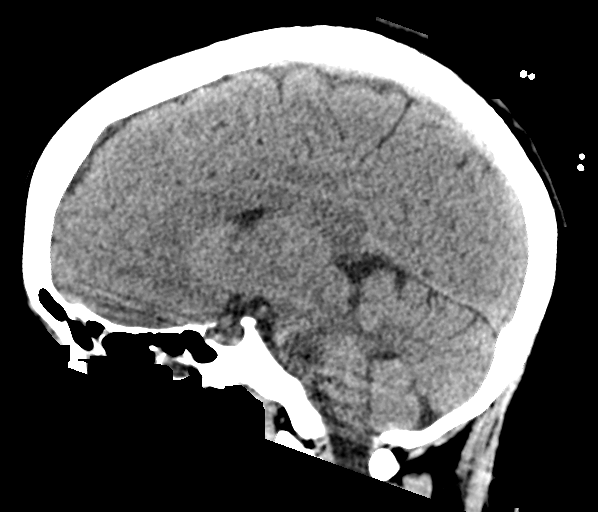
[im 39/58  brain]
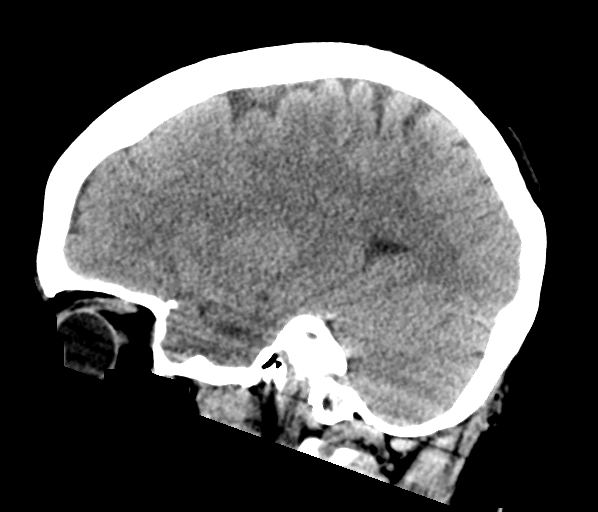

[16 of 47 positions shown; findings below may reference images not displayed]

FINDINGS: Brain: Normal anatomic configuration. No abnormal intra or
extra-axial mass lesion or fluid collection. No abnormal mass effect
or midline shift. No evidence of acute intracranial hemorrhage or
infarct. Ventricular size is normal. Cerebellum unremarkable.

Vascular: Unremarkable

Skull: Intact

Sinuses/Orbits: Moderate mucosal thickening within the visualized
paranasal sinuses with several opacified ethmoid air cells noted
bilaterally. Orbits are unremarkable.

Other: Mastoid air cells and middle ear cavities are clear.
IMPRESSION: No acute intracranial abnormality.

Moderate paranasal sinus disease.

## 2022-02-15 IMAGING — CR DG CHEST 2V
1 series · 2 of 2 positions shown · non-contrast
Comparison: 03/24/2016

CLINICAL DATA: Chest pain

EXAM:
CHEST - 2 VIEW

[Series 1: dg chest 2 view · 0.14mm/px · 2 of 2 slices shown]
[im 1/2]
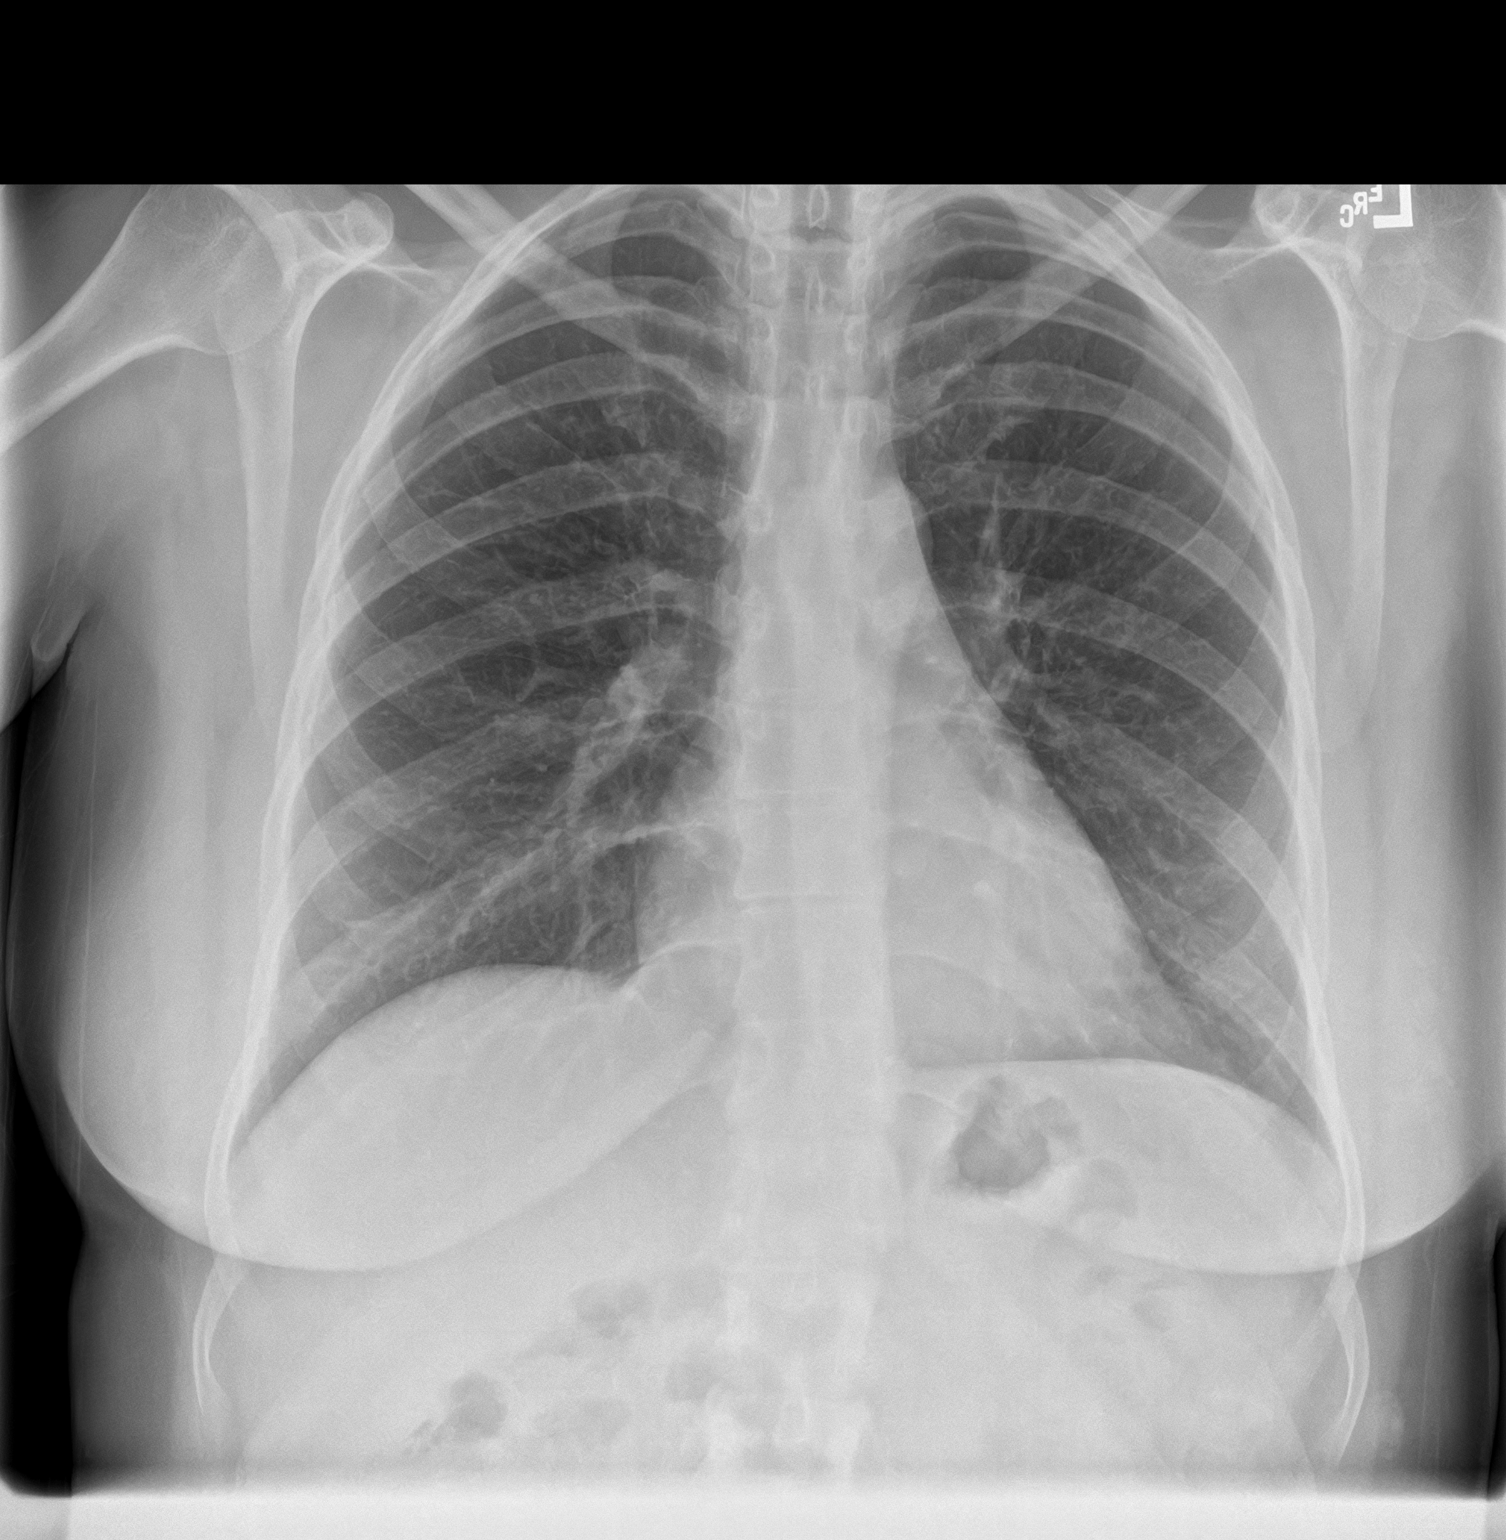
[im 2/2]
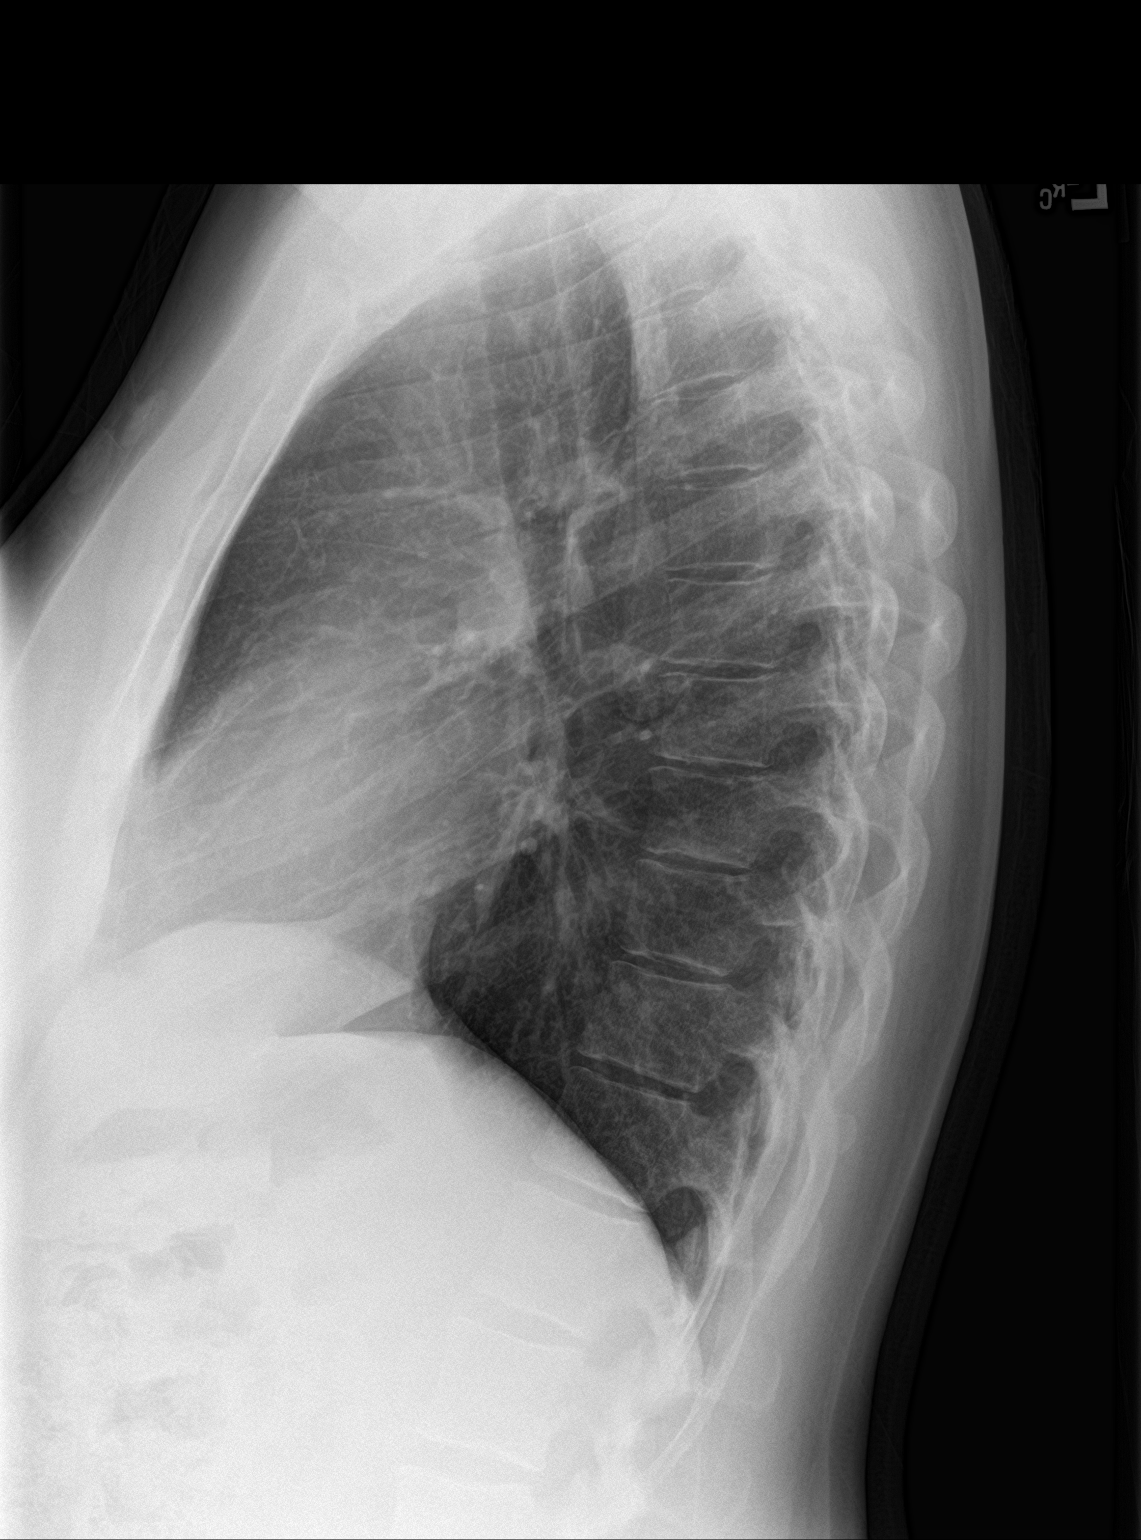

[2 of 2 positions shown; findings below may reference images not displayed]

FINDINGS: The heart size and mediastinal contours are within normal limits.
Both lungs are clear. The visualized skeletal structures are
unremarkable.
IMPRESSION: No active cardiopulmonary disease.

## 2022-05-30 ENCOUNTER — Emergency Department
Admission: EM | Admit: 2022-05-30 | Discharge: 2022-05-30 | Disposition: A | Discharge status: Home / Self Care | Payer: Self-pay | Attending: Emergency Medicine | Admitting: Emergency Medicine

## 2022-05-30 ENCOUNTER — Emergency Department: Payer: Self-pay

## 2022-05-30 ENCOUNTER — Other Ambulatory Visit: Payer: Self-pay

## 2022-05-30 DIAGNOSIS — S63501A Unspecified sprain of right wrist, initial encounter: Secondary | ICD-10-CM | POA: Insufficient documentation

## 2022-05-30 DIAGNOSIS — W010XXA Fall on same level from slipping, tripping and stumbling without subsequent striking against object, initial encounter: Secondary | ICD-10-CM | POA: Insufficient documentation

## 2022-05-30 DIAGNOSIS — S63601A Unspecified sprain of right thumb, initial encounter: Secondary | ICD-10-CM | POA: Insufficient documentation

## 2022-05-30 DIAGNOSIS — M79601 Pain in right arm: Secondary | ICD-10-CM

## 2022-05-30 MED ORDER — CYCLOBENZAPRINE HCL 5 MG PO TABS: 5.0000 mg | ORAL_TABLET | Freq: Three times a day (TID) | ORAL | 0 refills | Status: AC | PRN

## 2022-05-30 NOTE — ED Provider Triage Note (Signed)
Emergency Medicine Provider Triage Evaluation Note  Cynthia Norman , a 41 y.o. female  was evaluated in triage.  Pt complains of right upper extremity pain after fall on outstretched hand last night. No previous surgeries to same. No alleviating measures prior to arrival.   Physical Exam  There were no vitals taken for this visit. Gen:   Awake, no distress   Resp:  Normal effort  MSK:   Moves extremities without difficulty  Other:    Medical Decision Making  Medically screening exam initiated at 4:39 PM.  Appropriate orders placed.  Cynthia Norman was informed that the remainder of the evaluation will be completed by another provider, this initial triage assessment does not replace that evaluation, and the importance of remaining in the ED until their evaluation is complete.    Cynthia Pester, FNP 05/30/22 1641

## 2022-05-30 NOTE — ED Triage Notes (Signed)
Pt to ED for R wrist pain that extends from fingers to shoulder after mechanical fall last night. Full ROM but is guarding arm. States feels like thumb will pop out if moves hand too much. States when fell, landed on outstretched hand and pain is now radiating up into R shoulder.

## 2022-05-30 NOTE — Discharge Instructions (Addendum)
Your exam and XRs are normal and reassuring. You do not have any serious injuries including fractures or dislocations. Wear the wrist splint as needed. Follow-up with Ortho for ongoing symptoms.

## 2022-05-30 NOTE — ED Provider Notes (Signed)
[facility] Emergency Department Provider Note     Event Date/Time   First MD Initiated Contact with Patient 05/30/22 1813     (approximate)   History   Arm Pain and Wrist Pain   HPI  Cynthia Norman is a 41 y.o. female right-hand-dominant, presents to the ED following a mechanical fall last night.  Patient describes tripping and falling with outstretched right hand, but denies any head injury or LOC.  She presents today with active range of motion but guarded secondary to pain.  She reports feeling like her thumb is going to pop out if she flexes it across her palm.  She also reports some pain to the volar forearm.  She reports radiation of pain into her shoulder at this time.     Physical Exam   Triage Vital Signs: ED Triage Vitals  Enc Vitals Group     BP 05/30/22 1639 130/87     Pulse Rate 05/30/22 1639 81     Resp 05/30/22 1639 16     Temp 05/30/22 1639 98.5 F (36.9 C)     Temp Source 05/30/22 1639 Oral     SpO2 05/30/22 1639 100 %     Weight 05/30/22 1640 160 lb (72.6 kg)     Height 05/30/22 1640 5\' 5"  (1.651 m)     Head Circumference --      Peak Flow --      Pain Score 05/30/22 1639 9     Pain Loc --      Pain Edu? --      Excl. in GC? --     Most recent vital signs: Vitals:   05/30/22 1639 05/30/22 2021  BP: 130/87 133/85  Pulse: 81 77  Resp: 16 16  Temp: 98.5 F (36.9 C)   SpO2: 100% 100%    General Awake, no distress.  HEENT NCAT. PERRL. EOMI. No rhinorrhea. Mucous membranes are moist.  CV:  Good peripheral perfusion.  RESP:  Normal effort.  ABD:  No distention.  MSK:  Right arm without obvious deformity, dislocation, or sulcus sign.  Hand without any signs of edema or ecchymosis distally.  Normal composite fist on exam.  Patient normal flexion extension range of the wrist.  Able to supinate the forearm without difficulty.  Elbow normal flexion extension range.  The shoulder is able to be abducted without difficulty.   Some slow external rotation noted. NEURO: Cranial nerves II through XII grossly intact.  Normal UE DTRs bilaterally.  Normal interest and opposition testing noted.  Normal gross sensation.   ED Results / Procedures / Treatments   Labs (all labs ordered are listed, but only abnormal results are displayed) Labs Reviewed - No data to display   EKG   RADIOLOGY  I personally viewed and evaluated these images as part of my medical decision making, as well as reviewing the written report by the radiologist.  ED Provider Interpretation: no acute findings}  DG Forearm Right  Result Date: 05/30/2022 CLINICAL DATA:  Pain after fall. Mechanical fall last night. Right upper extremity pain. EXAM: RIGHT FOREARM - 2 VIEW COMPARISON:  None Available. FINDINGS: Cortical margins of the radius and ulna are intact. There is no evidence of fracture or other focal bone lesions. Wrist and elbow alignment are maintained. No elbow joint effusion. Soft tissues are unremarkable. IMPRESSION: Negative radiographs of the right forearm. Electronically Signed   By: 06/01/2022 M.D.   On: 05/30/2022 17:27  DG Hand Complete Right  Result Date: 05/30/2022 CLINICAL DATA:  Pain after fall. Mechanical fall last night. Right upper extremity pain. EXAM: RIGHT HAND - COMPLETE 3+ VIEW COMPARISON:  None Available. FINDINGS: There is no evidence of fracture or dislocation. Normal joint spaces and alignment. There is no evidence of arthropathy or other focal bone abnormality. Soft tissues are unremarkable. IMPRESSION: Negative radiographs of the right hand. Electronically Signed   By: Narda Rutherford M.D.   On: 05/30/2022 17:26   DG Humerus Right  Result Date: 05/30/2022 CLINICAL DATA:  Pain after fall. Mechanical fall last night. Right upper extremity pain. EXAM: RIGHT HUMERUS - 2+ VIEW COMPARISON:  None Available. FINDINGS: Cortical margins of the humerus are intact. There is no evidence of fracture or other focal bone  lesions. Shoulder and elbow alignment are maintained. Soft tissues are unremarkable. IMPRESSION: Negative radiographs of the right humerus, no fracture. Electronically Signed   By: Narda Rutherford M.D.   On: 05/30/2022 17:24     PROCEDURES:  Critical Care performed: No  Procedures   MEDICATIONS ORDERED IN ED: Medications - No data to display   IMPRESSION / MDM / ASSESSMENT AND PLAN / ED COURSE  I reviewed the triage vital signs and the nursing notes.                              Differential diagnosis includes, but is not limited to, wrist fracture, wrist sprain, forearm fracture, tendinitis, bursitis, rotator cuff tendinitis  Patient's presentation is most consistent with acute complicated illness / injury requiring diagnostic workup.  Patient to the ED for evaluation of right arm pain and disability following mechanical fall last night but she presents in no acute distress without any evidence of any acute fracture or dislocation, based on my review of images.  Clinically the patient presents with symptoms likely consistent of a thumb sprain and wrist sprain.  Patient's diagnosis is consistent with right arm pain secondary to thumb/wrist sprain. Patient will be discharged home with prescriptions for Flexeril and an abducted thumb wrist cock-up splint. Patient is to follow up with Ortho as needed or otherwise directed. Patient is given ED precautions to return to the ED for any worsening or new symptoms.     FINAL CLINICAL IMPRESSION(S) / ED DIAGNOSES   Final diagnoses:  Right arm pain  Sprain of right thumb, unspecified site of digit, initial encounter  Sprain of right wrist, initial encounter     Rx / DC Orders   ED Discharge Orders          Ordered    cyclobenzaprine (FLEXERIL) 5 MG tablet  3 times daily PRN        05/30/22 2017             Note:  This document was prepared using Dragon voice recognition software and may include unintentional dictation  errors.    Lissa Hoard, PA-C 05/30/22 2131    Sharyn Creamer, MD 06/07/22 [phone]
